# Patient Record
Sex: Female | Born: 1975
Health system: Southern US, Community
[De-identification: ages and names within clinical notes are randomized; demographics above are authoritative.]

## PROBLEM LIST (undated history)

## (undated) DIAGNOSIS — E038 Other specified hypothyroidism: Secondary | ICD-10-CM

## (undated) DIAGNOSIS — E063 Autoimmune thyroiditis: Secondary | ICD-10-CM

## (undated) HISTORY — DX: Other specified hypothyroidism: E03.8

## (undated) HISTORY — DX: Autoimmune thyroiditis: E06.3

---

## 2014-10-03 LAB — HM PAP SMEAR: HM Pap smear: NEGATIVE

## 2015-09-05 DIAGNOSIS — H5213 Myopia, bilateral: Secondary | ICD-10-CM | POA: Diagnosis not present

## 2015-09-05 DIAGNOSIS — H52223 Regular astigmatism, bilateral: Secondary | ICD-10-CM | POA: Diagnosis not present

## 2015-09-05 DIAGNOSIS — H5201 Hypermetropia, right eye: Secondary | ICD-10-CM | POA: Diagnosis not present

## 2015-09-13 DIAGNOSIS — J069 Acute upper respiratory infection, unspecified: Secondary | ICD-10-CM | POA: Diagnosis not present

## 2015-09-19 DIAGNOSIS — J029 Acute pharyngitis, unspecified: Secondary | ICD-10-CM | POA: Diagnosis not present

## 2015-09-19 DIAGNOSIS — J321 Chronic frontal sinusitis: Secondary | ICD-10-CM | POA: Diagnosis not present

## 2015-09-19 DIAGNOSIS — R0981 Nasal congestion: Secondary | ICD-10-CM | POA: Diagnosis not present

## 2015-09-19 DIAGNOSIS — R030 Elevated blood-pressure reading, without diagnosis of hypertension: Secondary | ICD-10-CM | POA: Diagnosis not present

## 2015-10-24 DIAGNOSIS — E02 Subclinical iodine-deficiency hypothyroidism: Secondary | ICD-10-CM | POA: Diagnosis not present

## 2015-10-24 DIAGNOSIS — E538 Deficiency of other specified B group vitamins: Secondary | ICD-10-CM | POA: Diagnosis not present

## 2015-10-24 DIAGNOSIS — Z6829 Body mass index (BMI) 29.0-29.9, adult: Secondary | ICD-10-CM | POA: Diagnosis not present

## 2015-10-24 DIAGNOSIS — E559 Vitamin D deficiency, unspecified: Secondary | ICD-10-CM | POA: Diagnosis not present

## 2015-10-24 DIAGNOSIS — Z01419 Encounter for gynecological examination (general) (routine) without abnormal findings: Secondary | ICD-10-CM | POA: Diagnosis not present

## 2016-04-07 DIAGNOSIS — N390 Urinary tract infection, site not specified: Secondary | ICD-10-CM | POA: Diagnosis not present

## 2016-05-02 DIAGNOSIS — N10 Acute tubulo-interstitial nephritis: Secondary | ICD-10-CM | POA: Diagnosis not present

## 2016-05-18 DIAGNOSIS — N390 Urinary tract infection, site not specified: Secondary | ICD-10-CM | POA: Diagnosis not present

## 2016-06-28 DIAGNOSIS — J069 Acute upper respiratory infection, unspecified: Secondary | ICD-10-CM | POA: Diagnosis not present

## 2016-08-06 DIAGNOSIS — Z719 Counseling, unspecified: Secondary | ICD-10-CM | POA: Diagnosis not present

## 2016-08-06 DIAGNOSIS — R350 Frequency of micturition: Secondary | ICD-10-CM | POA: Diagnosis not present

## 2016-10-13 DIAGNOSIS — L293 Anogenital pruritus, unspecified: Secondary | ICD-10-CM | POA: Diagnosis not present

## 2016-10-13 DIAGNOSIS — L723 Sebaceous cyst: Secondary | ICD-10-CM | POA: Diagnosis not present

## 2016-10-26 DIAGNOSIS — Z6829 Body mass index (BMI) 29.0-29.9, adult: Secondary | ICD-10-CM | POA: Diagnosis not present

## 2016-10-26 DIAGNOSIS — Z1231 Encounter for screening mammogram for malignant neoplasm of breast: Secondary | ICD-10-CM | POA: Diagnosis not present

## 2016-10-26 DIAGNOSIS — E02 Subclinical iodine-deficiency hypothyroidism: Secondary | ICD-10-CM | POA: Diagnosis not present

## 2016-10-26 DIAGNOSIS — Z01419 Encounter for gynecological examination (general) (routine) without abnormal findings: Secondary | ICD-10-CM | POA: Diagnosis not present

## 2016-10-28 DIAGNOSIS — H527 Unspecified disorder of refraction: Secondary | ICD-10-CM | POA: Diagnosis not present

## 2016-11-24 DIAGNOSIS — J069 Acute upper respiratory infection, unspecified: Secondary | ICD-10-CM | POA: Diagnosis not present

## 2016-11-24 DIAGNOSIS — H6502 Acute serous otitis media, left ear: Secondary | ICD-10-CM | POA: Diagnosis not present

## 2016-11-24 DIAGNOSIS — B9789 Other viral agents as the cause of diseases classified elsewhere: Secondary | ICD-10-CM | POA: Diagnosis not present

## 2017-03-04 DIAGNOSIS — E038 Other specified hypothyroidism: Secondary | ICD-10-CM | POA: Insufficient documentation

## 2017-03-04 DIAGNOSIS — E063 Autoimmune thyroiditis: Secondary | ICD-10-CM | POA: Diagnosis not present

## 2017-03-04 DIAGNOSIS — Z6830 Body mass index (BMI) 30.0-30.9, adult: Secondary | ICD-10-CM | POA: Diagnosis not present

## 2017-03-04 DIAGNOSIS — E663 Overweight: Secondary | ICD-10-CM | POA: Insufficient documentation

## 2017-03-04 DIAGNOSIS — Z79899 Other long term (current) drug therapy: Secondary | ICD-10-CM | POA: Diagnosis not present

## 2017-03-04 HISTORY — DX: Other specified hypothyroidism: E03.8

## 2017-03-04 HISTORY — DX: Autoimmune thyroiditis: E06.3

## 2017-03-10 ENCOUNTER — Ambulatory Visit (INDEPENDENT_AMBULATORY_CARE_PROVIDER_SITE_OTHER): Payer: BLUE CROSS/BLUE SHIELD | Admitting: Family Medicine

## 2017-03-10 ENCOUNTER — Encounter: Payer: Self-pay | Admitting: Family Medicine

## 2017-03-10 ENCOUNTER — Encounter (INDEPENDENT_AMBULATORY_CARE_PROVIDER_SITE_OTHER): Payer: Self-pay

## 2017-03-10 VITALS — BP 138/84 | HR 65 | Ht 67.0 in | Wt 190.0 lb

## 2017-03-10 DIAGNOSIS — E063 Autoimmune thyroiditis: Secondary | ICD-10-CM | POA: Diagnosis not present

## 2017-03-10 DIAGNOSIS — E663 Overweight: Secondary | ICD-10-CM

## 2017-03-10 DIAGNOSIS — E038 Other specified hypothyroidism: Secondary | ICD-10-CM | POA: Diagnosis not present

## 2017-03-10 NOTE — Progress Notes (Signed)
Subjective:    Patient ID: Megan Chavez, female    DOB: May 09, 1975, 41 y.o.   MRN: [id number]  HPI 41 year old female with a history of hypothyroidism comes in today to establish care.  I also see her husband Megan as a patient.  Her mother is Megan Chavez who is also a patient here.  She was diagnosed with Hashimoto's thyroiditis about a year and a half ago.  She is currently on levothyroxine.  Her dose was just increased a couple of weeks ago to 75 mcg daily.  She has a follow-up in about 4 weeks with her endocrinologist Dr. Rodney Booze.  She is coming in today to get established since she recently moved here with her husband and 3 children.  She admits she is gained a lot of weight over the last several years and thinks is partly because her thyroid was not well controlled.  She is trying to work on getting this back under control.  Review of Systems  Constitutional: Negative for diaphoresis, fever and unexpected weight change.  HENT: Negative for hearing loss, postnasal drip, sneezing and tinnitus.   Eyes: Negative for visual disturbance.  Respiratory: Negative for cough and wheezing.   Cardiovascular: Negative for chest pain and palpitations.  Genitourinary: Negative for vaginal bleeding and vaginal discharge.  Musculoskeletal: Negative for arthralgias.  Neurological: Negative for headaches.  Hematological: Negative for adenopathy. Does not bruise/bleed easily.     BP 138/84   Pulse 65   Ht 5' 7"  (1.702 m)   Wt 190 lb (86.2 kg)   BMI 29.76 kg/m     No Known Allergies  No past medical history on file.  Past Surgical History:  Procedure Laterality Date  . CESAREAN SECTION  10/2000  . CESAREAN SECTION  02/2003  . CESAREAN SECTION  06/2006    Social History   Socioeconomic History  . Marital status: Married    Spouse name: Lileigh Chavez  . Number of children: 3  . Years of education: Not on file  . Highest education level: Not on file  Social Needs  .  Financial resource strain: Not on file  . Food insecurity - worry: Not on file  . Food insecurity - inability: Not on file  . Transportation needs - medical: Not on file  . Transportation needs - non-medical: Not on file  Occupational History  . Occupation: Biochemist, clinical    Comment: [employer]   Tobacco Use  . Smoking status: Former Smoker    Last attempt to quit: 03/10/2000    Years since quitting: 17.0  Substance and Sexual Activity  . Alcohol use: Yes    Comment: 0-1  . Drug use: No  . Sexual activity: Yes  Other Topics Concern  . Not on file  Social History Narrative   She exercises 2-3 days/week for about 30 minutes.  2 caffeinated beverages daily.    Family History  Problem Relation Age of Onset  . Stroke Father   . Hypertension Father   . Hashimoto's thyroiditis Sister   . Leukemia Daughter     Outpatient Encounter Medications as of 03/10/2017  Medication Sig  . levothyroxine (SYNTHROID, LEVOTHROID) 75 MCG tablet Take by mouth.   No facility-administered encounter medications on file as of 03/10/2017.          Objective:   Physical Exam  Constitutional: She is oriented to person, place, and time. She appears well-developed and well-nourished.  HENT:  Head: Normocephalic and atraumatic.  Cardiovascular: Normal  rate, regular rhythm and normal heart sounds.  Pulmonary/Chest: Effort normal and breath sounds normal.  Neurological: She is alert and oriented to person, place, and time.  Skin: Skin is warm and dry.  Psychiatric: She has a normal mood and affect. Her behavior is normal.        Assessment & Plan:  Hashimoto's thyroiditis-following with endocrinology.  Medication recently changed will keep follow-up in about 4 weeks to recheck her levels.  She reports that her Pap smear mammogram are up-to-date.  She saw her OB/GYN in the summer and plans to have a repeat Pap smear in 5 years.  We will try to get these records and get our information  updated.  She is unsure about when her last tetanus vaccine was but plans to get this set information from records that we can make sure she is up-to-date if needed.  Overweight-she is planning on working on trying to lose weight.  Now that she feels like her thyroid is doing better.

## 2017-03-28 ENCOUNTER — Encounter: Payer: Self-pay | Admitting: Family Medicine

## 2017-03-28 ENCOUNTER — Ambulatory Visit (INDEPENDENT_AMBULATORY_CARE_PROVIDER_SITE_OTHER): Payer: BLUE CROSS/BLUE SHIELD

## 2017-03-28 ENCOUNTER — Ambulatory Visit (INDEPENDENT_AMBULATORY_CARE_PROVIDER_SITE_OTHER): Payer: BLUE CROSS/BLUE SHIELD | Admitting: Family Medicine

## 2017-03-28 VITALS — BP 138/68 | HR 74 | Ht 67.0 in | Wt 189.0 lb

## 2017-03-28 DIAGNOSIS — R1011 Right upper quadrant pain: Secondary | ICD-10-CM

## 2017-03-28 DIAGNOSIS — R140 Abdominal distension (gaseous): Secondary | ICD-10-CM | POA: Diagnosis not present

## 2017-03-28 NOTE — Progress Notes (Signed)
Subjective:    Patient ID: Megan Chavez, female    DOB: 05/21/1975, 41 y.o.   MRN: [id number]  HPI 41 year old female comes in today complaining of right upper quadrant pain just underneath the ribs but a little laterally.  She says sometimes it will actually radiate around to her back in the same area.  She describes it as a dull ache.  She is been more gassy and belching than usual.  She has had a little bit more reflux and even some rash recently.  She denies any fever sweats or chills.  She had one day where she felt a little nauseated but that was it.  No vomiting.  She says initially she was constipated but then yesterday she actually had a little bit of diarrhea.  She is tried Gas-X, Mylanta, and Tums with minimal relief.  She describes the pain as mild to moderate.   Review of Systems  BP 138/68   Pulse 74   Ht 5' 7"  (1.702 m)   Wt 189 lb (85.7 kg)   SpO2 100%   BMI 29.60 kg/m     No Known Allergies  No past medical history on file.  Past Surgical History:  Procedure Laterality Date  . CESAREAN SECTION  10/2000  . CESAREAN SECTION  02/2003  . CESAREAN SECTION  06/2006    Social History   Socioeconomic History  . Marital status: Married    Spouse name: Vonne Mcdanel  . Number of children: 3  . Years of education: Not on file  . Highest education level: Not on file  Social Needs  . Financial resource strain: Not on file  . Food insecurity - worry: Not on file  . Food insecurity - inability: Not on file  . Transportation needs - medical: Not on file  . Transportation needs - non-medical: Not on file  Occupational History  . Occupation: Biochemist, clinical    Comment: [employer]   Tobacco Use  . Smoking status: Former Smoker    Last attempt to quit: 03/10/2000    Years since quitting: 17.0  . Smokeless tobacco: Never Used  Substance and Sexual Activity  . Alcohol use: Yes    Comment: 0-1  . Drug use: No  . Sexual activity: Yes  Other Topics Concern  .  Not on file  Social History Narrative   She exercises 2-3 days/week for about 30 minutes.  2 caffeinated beverages daily.    Family History  Problem Relation Age of Onset  . Stroke Father   . Hypertension Father   . Hashimoto's thyroiditis Sister   . Leukemia Daughter     Outpatient Encounter Medications as of 03/28/2017  Medication Sig  . levothyroxine (SYNTHROID, LEVOTHROID) 75 MCG tablet Take by mouth.   No facility-administered encounter medications on file as of 03/28/2017.          Objective:   Physical Exam  Constitutional: She is oriented to person, place, and time. She appears well-developed and well-nourished.  HENT:  Head: Normocephalic and atraumatic.  Cardiovascular: Normal rate, regular rhythm and normal heart sounds.  Pulmonary/Chest: Effort normal and breath sounds normal.  Abdominal: Soft. Bowel sounds are normal. She exhibits no distension and no mass. There is tenderness. There is no rebound and no guarding.  No tenderness in the right upper quadrant.  No rebound or guarding.  Neurological: She is alert and oriented to person, place, and time.  Skin: Skin is warm and dry.  Psychiatric: She has a normal  mood and affect. Her behavior is normal.        Assessment & Plan:  Right upper quadrant pain radiating into her back.  Will check liver enzymes as well as renal function.  Consider gallbladder though she is not having any nausea.  I am most suspicious of increased stool burden so we will start with a KUB and then call her with results once available.  If she does have increased stool then will treat with MiraLAX.  If not then we will move forward with ultrasound of the gallbladder.

## 2017-04-29 DIAGNOSIS — J019 Acute sinusitis, unspecified: Secondary | ICD-10-CM | POA: Diagnosis not present

## 2017-07-18 ENCOUNTER — Encounter: Payer: Self-pay | Admitting: Family Medicine

## 2017-07-18 ENCOUNTER — Ambulatory Visit (INDEPENDENT_AMBULATORY_CARE_PROVIDER_SITE_OTHER): Payer: BLUE CROSS/BLUE SHIELD | Admitting: Family Medicine

## 2017-07-18 VITALS — BP 144/86 | HR 73 | Temp 98.1°F | Ht 67.0 in | Wt 190.0 lb

## 2017-07-18 DIAGNOSIS — L301 Dyshidrosis [pompholyx]: Secondary | ICD-10-CM | POA: Diagnosis not present

## 2017-07-18 DIAGNOSIS — Z23 Encounter for immunization: Secondary | ICD-10-CM

## 2017-07-18 DIAGNOSIS — L42 Pityriasis rosea: Secondary | ICD-10-CM

## 2017-07-18 MED ORDER — CLOBETASOL PROPIONATE 0.05 % EX CREA: 1.0000 "application " | TOPICAL_CREAM | Freq: Two times a day (BID) | CUTANEOUS | 2 refills | Status: DC

## 2017-07-18 MED ORDER — TRIAMCINOLONE 0.1 % CREAM:EUCERIN CREAM 1:1: 1.0000 "application " | TOPICAL_CREAM | Freq: Two times a day (BID) | CUTANEOUS | 12 refills | Status: DC | PRN

## 2017-07-18 NOTE — Progress Notes (Signed)
Megan Chavez is a 42 y.o. female with a history of hypothyroid, seasonal allergies, and dyshidrotic eczema who presents to [facility] today for evaluation of a rash. Megan Chavez reports that she developed a small rash on her left midsection about 4 weeks ago. Since then, it has spread around her midsection and onto her upper torso as well. She states that she has moderate seasonal allergies and was recently having problems with an indoor christmas tree. She denies shortness of breath, wheezing, and a cough. She denies recently switching laundry detergent and soaps. She states she has been treating this with hydrocortisone cream, allegra-D, Eucerin cream, and nightly benadryl with only mild relief.    No past medical history on file. Past Surgical History:  Procedure Laterality Date  . CESAREAN SECTION  10/2000  . CESAREAN SECTION  02/2003  . CESAREAN SECTION  06/2006   Social History   Tobacco Use  . Smoking status: Former Smoker    Last attempt to quit: 03/10/2000    Years since quitting: 17.3  . Smokeless tobacco: Never Used  Substance Use Topics  . Alcohol use: Yes    Comment: 0-1   family history includes Hashimoto's thyroiditis in her sister; Hypertension in her father; Leukemia in her daughter; Stroke in her father.  ROS as above:  Medications: Current Outpatient Medications  Medication Sig Dispense Refill  . Fexofenadine-Pseudoephedrine (ALLEGRA-D 24 HOUR PO) Take 1 tablet by mouth daily.    Marland Kitchen levothyroxine (SYNTHROID, LEVOTHROID) 75 MCG tablet Take by mouth.    . clobetasol cream (TEMOVATE) 2.54 % Apply 1 application topically 2 (two) times daily. Use on hot spots 60 g 2  . Triamcinolone Acetonide (TRIAMCINOLONE 0.1 % CREAM : EUCERIN) CREA Apply 1 application topically 2 (two) times daily as needed for itching. 50/50. Disp 1 pound 1 each 12   No current  facility-administered medications for this visit.    No Known Allergies  Health Maintenance Health Maintenance  Topic Date Due  . HIV Screening  09/20/1989  . TETANUS/TDAP  09/20/1993  . PAP SMEAR  09/21/1995  . INFLUENZA VACCINE  Completed     Exam:  BP (!) 144/86   Pulse 73   Temp 98.1 F (36.7 C) (Oral)   Ht 5' 7"  (1.702 m)   Wt 190 lb (86.2 kg)   BMI 29.76 kg/m  Gen: Well NAD HEENT: EOMI,  MMM Lungs: Normal work of breathing. CTABL Heart: RRR no MRG Abd: NABS, Soft. Nondistended, Nontender Exts: Brisk capillary refill, warm and well perfused.  Skin: Lower torso is covered with well demarcated raised erythematous ovid lesion with scale. Upper chest and shoulders have same but reduced.  Larger patch on left lower abdomen present. Hands are free from rash.     Assessment and Plan: 42 y.o. female with a history of eczema and seasonal allergies who is presenting with a rash on her lower torso and shoulders. She states that it started with one spot on her lower left stomach and spread from there. This initial lesion followed by rash spreading on her torso sounds like a "harold patch" followed by pityriasis rosea. She does not have wheezing, shortness of breath, or lung findings, so anaphylaxis is not likely.   Rash: Prescribe clobetosol for hot spots. 50/50 Eucerine/triamcinolone cream for application across torso.  Use clobetasol for hot spots.  Continue benadryl for itch relief at night and help with sleep. Continue allegra-D for seasonal allergies.  Call back if worsens  Dyshidrotic eczema: Well controlled currently.  Plan for watchful waiting use clobetasol as needed.  Tdap given.   Orders Placed This Encounter  Procedures  . Tdap vaccine greater than or equal to 7yo IM   Meds ordered this encounter  Medications  . Triamcinolone Acetonide (TRIAMCINOLONE 0.1 % CREAM : EUCERIN) CREA    Sig: Apply 1 application topically 2 (two) times daily as needed for itching.  50/50. Disp 1 pound    Dispense:  1 each    Refill:  12  . clobetasol cream (TEMOVATE) 0.05 %    Sig: Apply 1 application topically 2 (two) times daily. Use on hot spots    Dispense:  60 g    Refill:  2     Discussed warning signs or symptoms. Please see discharge instructions. Patient expresses understanding.

## 2017-07-18 NOTE — Patient Instructions (Addendum)
Thank you for coming in today. Use the triamcinolone cream on the body and clobetasol on the bad hot spots.  Recheck as needed.  This should fade in a few months.   We will get records.   Recheck as needed.   Pityriasis Rosea Pityriasis rosea is a rash that usually appears on the trunk of the body. It may also appear on the upper arms and upper legs. It usually begins as a single patch, and then more patches begin to develop. The rash may cause mild itching, but it normally does not cause other problems. It usually goes away without treatment. However, it may take weeks or months for the rash to go away completely. What are the causes? The cause of this condition is not known. The condition does not spread from person to person (is noncontagious). What increases the risk? This condition is more likely to develop in young adults and children. It is most common in the spring and fall. What are the signs or symptoms? The main symptom of this condition is a rash.  The rash usually begins with a single oval patch that is larger than the ones that follow. This is called a herald patch. It generally appears a week or more before the rest of the rash appears.  When more patches start to develop, they spread quickly on the trunk, back, and arms. These patches are smaller than the first one.  The patches that make up the rash are usually oval-shaped and pink or red in color. They are usually flat, but they may sometimes be raised so that they can be felt with a finger. They may also be finely crinkled and have a scaly ring around the edge.  The rash does not typically appear on areas of the skin that are exposed to the sun.  Most people who have this condition do not have other symptoms, but some have mild itching. In a few cases, a mild headache or body aches may occur before the rash appears and then go away. How is this diagnosed? Your health care provider may diagnose this condition by doing a  physical exam and taking your medical history. To rule out other possible causes for the rash, the health care provider may order blood tests or take a skin sample from the rash to be looked at under a microscope. How is this treated? Usually, treatment is not needed for this condition. The rash will probably go away on its own in 4-8 weeks. In some cases, a health care provider may recommend or prescribe medicine to reduce itching. Follow these instructions at home:  Take medicines only as directed by your health care provider.  Avoid scratching the affected areas of skin.  Do not take hot baths or use a sauna. Use only warm water when bathing or showering. Heat can increase itching. Contact a health care provider if:  Your rash does not go away in 8 weeks.  Your rash gets much worse.  You have a fever.  You have swelling or pain in the rash area.  You have fluid, blood, or pus coming from the rash area. This information is not intended to replace advice given to you by your health care provider. Make sure you discuss any questions you have with your health care provider. Document Released: 05/26/2001 Document Revised: 09/25/2015 Document Reviewed: 03/27/2014 Elsevier Interactive Patient Education  [employer].

## 2017-08-04 ENCOUNTER — Telehealth: Payer: Self-pay | Admitting: Family Medicine

## 2017-08-04 ENCOUNTER — Other Ambulatory Visit: Payer: Self-pay

## 2017-08-04 NOTE — Addendum Note (Signed)
Addended by: Dessie Coma on: 08/04/2017 08:22 AM   Modules accepted: Orders

## 2017-08-04 NOTE — Telephone Encounter (Signed)
Approvedtoday  Effective from 08/04/2017 through 08/03/2018.

## 2017-08-16 ENCOUNTER — Encounter: Payer: Self-pay | Admitting: Family Medicine

## 2017-08-16 NOTE — Progress Notes (Signed)
Negative for intraepithelial lesion or malignancy.

## 2017-11-22 DIAGNOSIS — E063 Autoimmune thyroiditis: Secondary | ICD-10-CM | POA: Diagnosis not present

## 2017-11-22 DIAGNOSIS — Z6830 Body mass index (BMI) 30.0-30.9, adult: Secondary | ICD-10-CM | POA: Diagnosis not present

## 2017-11-22 DIAGNOSIS — E039 Hypothyroidism, unspecified: Secondary | ICD-10-CM | POA: Diagnosis not present

## 2017-11-22 DIAGNOSIS — E038 Other specified hypothyroidism: Secondary | ICD-10-CM | POA: Diagnosis not present

## 2017-11-22 DIAGNOSIS — Z79899 Other long term (current) drug therapy: Secondary | ICD-10-CM | POA: Diagnosis not present

## 2017-11-22 DIAGNOSIS — E669 Obesity, unspecified: Secondary | ICD-10-CM | POA: Diagnosis not present

## 2017-12-21 DIAGNOSIS — Z124 Encounter for screening for malignant neoplasm of cervix: Secondary | ICD-10-CM | POA: Diagnosis not present

## 2017-12-21 DIAGNOSIS — Z01419 Encounter for gynecological examination (general) (routine) without abnormal findings: Secondary | ICD-10-CM | POA: Diagnosis not present

## 2017-12-21 DIAGNOSIS — R030 Elevated blood-pressure reading, without diagnosis of hypertension: Secondary | ICD-10-CM | POA: Diagnosis not present

## 2017-12-21 DIAGNOSIS — Z1231 Encounter for screening mammogram for malignant neoplasm of breast: Secondary | ICD-10-CM | POA: Diagnosis not present

## 2017-12-22 DIAGNOSIS — R928 Other abnormal and inconclusive findings on diagnostic imaging of breast: Secondary | ICD-10-CM | POA: Insufficient documentation

## 2017-12-28 DIAGNOSIS — R922 Inconclusive mammogram: Secondary | ICD-10-CM | POA: Diagnosis not present

## 2018-01-05 DIAGNOSIS — S9002XA Contusion of left ankle, initial encounter: Secondary | ICD-10-CM | POA: Diagnosis not present

## 2018-01-05 DIAGNOSIS — L089 Local infection of the skin and subcutaneous tissue, unspecified: Secondary | ICD-10-CM | POA: Diagnosis not present

## 2018-01-05 DIAGNOSIS — S93402A Sprain of unspecified ligament of left ankle, initial encounter: Secondary | ICD-10-CM | POA: Diagnosis not present

## 2018-01-05 DIAGNOSIS — S99912A Unspecified injury of left ankle, initial encounter: Secondary | ICD-10-CM | POA: Diagnosis not present

## 2018-01-05 DIAGNOSIS — S5011XA Contusion of right forearm, initial encounter: Secondary | ICD-10-CM | POA: Diagnosis not present

## 2018-01-11 ENCOUNTER — Ambulatory Visit (INDEPENDENT_AMBULATORY_CARE_PROVIDER_SITE_OTHER): Payer: BLUE CROSS/BLUE SHIELD | Admitting: Family Medicine

## 2018-01-11 ENCOUNTER — Encounter: Payer: Self-pay | Admitting: Family Medicine

## 2018-01-11 VITALS — BP 133/71 | HR 70 | Ht 67.0 in | Wt 192.0 lb

## 2018-01-11 DIAGNOSIS — L03116 Cellulitis of left lower limb: Secondary | ICD-10-CM

## 2018-01-11 MED ORDER — DOXYCYCLINE HYCLATE 100 MG PO TABS: 100.0000 mg | ORAL_TABLET | Freq: Two times a day (BID) | ORAL | 0 refills | Status: DC

## 2018-01-11 MED ORDER — MUPIROCIN 2 % EX OINT: TOPICAL_OINTMENT | CUTANEOUS | 3 refills | Status: DC

## 2018-01-11 NOTE — Patient Instructions (Addendum)
Thank you for coming in today. Keep the wound covered with ointment Use Mupericin ointment 2x daily.  STOP keflex Start doxycycline Do not take with calcium or iron.   It may increase the risk of sunburn so cover up.   Let us know when you get the flu shot.   If not getting better let me know.     Cellulitis, Adult Cellulitis is a skin infection. The infected area is usually red and tender. This condition occurs most often in the arms and lower legs. The infection can travel to the muscles, blood, and underlying tissue and become serious. It is very important to get treated for this condition. What are the causes? Cellulitis is caused by bacteria. The bacteria enter through a break in the skin, such as a cut, burn, insect bite, open sore, or crack. What increases the risk? This condition is more likely to occur in people who:  Have a weak defense system (immune system).  Have open wounds on the skin such as cuts, burns, bites, and scrapes. Bacteria can enter the body through these open wounds.  Are older.  Have diabetes.  Have a type of long-lasting (chronic) liver disease (cirrhosis) or kidney disease.  Use IV drugs.  What are the signs or symptoms? Symptoms of this condition include:  Redness, streaking, or spotting on the skin.  Swollen area of the skin.  Tenderness or pain when an area of the skin is touched.  Warm skin.  Fever.  Chills.  Blisters.  How is this diagnosed? This condition is diagnosed based on a medical history and physical exam. You may also have tests, including:  Blood tests.  Lab tests.  Imaging tests.  How is this treated? Treatment for this condition may include:  Medicines, such as antibiotic medicines or antihistamines.  Supportive care, such as rest and application of cold or warm cloths (cold or warm compresses) to the skin.  Hospital care, if the condition is severe.  The infection usually gets better within 1-2 days  of treatment. Follow these instructions at home:  Take over-the-counter and prescription medicines only as told by your health care provider.  If you were prescribed an antibiotic medicine, take it as told by your health care provider. Do not stop taking the antibiotic even if you start to feel better.  Drink enough fluid to keep your urine clear or pale yellow.  Do not touch or rub the infected area.  Raise (elevate) the infected area above the level of your heart while you are sitting or lying down.  Apply warm or cold compresses to the affected area as told by your health care provider.  Keep all follow-up visits as told by your health care provider. This is important. These visits let your health care provider make sure a more serious infection is not developing. Contact a health care provider if:  You have a fever.  Your symptoms do not improve within 1-2 days of starting treatment.  Your bone or joint underneath the infected area becomes painful after the skin has healed.  Your infection returns in the same area or another area.  You notice a swollen bump in the infected area.  You develop new symptoms.  You have a general ill feeling (malaise) with muscle aches and pains. Get help right away if:  Your symptoms get worse.  You feel very sleepy.  You develop vomiting or diarrhea that persists.  You notice red streaks coming from the infected area.  Your  red area gets larger or turns dark in color. This information is not intended to replace advice given to you by your health care provider. Make sure you discuss any questions you have with your health care provider. Document Released: 01/27/2005 Document Revised: 08/28/2015 Document Reviewed: 02/26/2015 Elsevier Interactive Patient Education  [employer].

## 2018-01-11 NOTE — Progress Notes (Signed)
Megan Chavez is a 42 y.o. female who presents to [facility] today for skin infection.   Megan Chavez was knocked over by her dog on September 3.  She suffered a contusion and abrasion to her left medial ankle.  She was seen in urgent care on the fifth where she was prescribed Keflex for the skin abrasion.  She notes that the contusion and ankle soreness has improved however she continues to have a ulcer with red tender skin on her medial ankle that is not improving with oral Keflex and topical Neosporin cream.  She denies fevers chills nausea vomiting or diarrhea.   ROS as above:  Exam:  BP 133/71   Pulse 70   Ht 5' 7"  (1.702 m)   Wt 192 lb (87.1 kg)   BMI 30.07 kg/m  Wt Readings from Last 5 Encounters:  01/11/18 192 lb (87.1 kg)  07/18/17 190 lb (86.2 kg)  03/28/17 189 lb (85.7 kg)  03/10/17 190 lb (86.2 kg)    Gen: Well NAD HEENT: EOMI,  MMM Lungs: Normal work of breathing. CTABL Heart: RRR no MRG Abd: NABS, Soft. Nondistended, Nontender Exts: Brisk capillary refill, warm and well perfused.  Skin left ankle shallow ulceration with surrounding erythema approximately 1 cm left medial ankle.  Tender to palpation no discharge present.  No significant induration or fluctuance. MSK: Left foot and ankle normal motion.  Normal gait.        Assessment and Plan: 42 y.o. female with cellulitis abrasion/ulceration.  Not improving with conventional typical oral and topical antibiotics.  Plan to stop oral Keflex and Neosporin cream.  Treat with oral doxycycline and topical mupirocin antibiotic ointment.  Recheck if not improving.  Tdap vaccine up-to-date March 2019.   No orders of the defined types were placed in this encounter.  Meds ordered this encounter  Medications  . doxycycline (VIBRA-TABS) 100 MG tablet    Sig: Take 1 tablet (100 mg total) by mouth 2 (two)  times daily.    Dispense:  14 tablet    Refill:  0  . mupirocin ointment (BACTROBAN) 2 %    Sig: Apply to affected area BID for 7 days.    Dispense:  30 g    Refill:  3     Historical information moved to improve visibility of documentation.  Past Medical History:  Diagnosis Date  . Hypothyroidism due to Hashimoto's thyroiditis 03/04/2017   Past Surgical History:  Procedure Laterality Date  . CESAREAN SECTION  10/2000  . CESAREAN SECTION  02/2003  . CESAREAN SECTION  06/2006   Social History   Tobacco Use  . Smoking status: Former Smoker    Last attempt to quit: 03/10/2000    Years since quitting: 17.8  . Smokeless tobacco: Never Used  Substance Use Topics  . Alcohol use: Yes    Comment: 0-1   family history includes Hashimoto's thyroiditis in her sister; Hypertension in her father; Leukemia in her daughter; Stroke in her father.  Medications: Current Outpatient Medications  Medication Sig Dispense Refill  . albuterol (PROAIR HFA) 108 (90 Base) MCG/ACT inhaler ProAir HFA 90 mcg/actuation aerosol inhaler  USE 2 PUFFS INHALE EVERY 4 HOURS AS NEEDED (PRN) WHEEZING    . clobetasol cream (TEMOVATE) 7.41 % Apply 1 application topically 2 (two) times daily. Use on hot spots 60 g 2  . Fexofenadine-Pseudoephedrine (ALLEGRA-D 24 HOUR PO) Take 1 tablet by mouth daily.    Marland Kitchen  hyoscyamine (ANASPAZ) 0.125 MG TBDP disintergrating tablet hyoscyamine 0.125 mg disintegrating tablet  take 1 tablet by mouth under the tongue every 4 to 6 hours if needed for cramping    . levothyroxine (SYNTHROID, LEVOTHROID) 75 MCG tablet Take by mouth.    . promethazine-dextromethorphan (PROMETHAZINE-DM) 6.25-15 MG/5ML syrup promethazine-DM 6.25 mg-15 mg/5 mL oral syrup    . Triamcinolone Acetonide (TRIAMCINOLONE 0.1 % CREAM : EUCERIN) CREA Apply 1 application topically 2 (two) times daily as needed for itching. 50/50. Disp 1 pound 1 each 12  . doxycycline (VIBRA-TABS) 100 MG tablet Take 1 tablet (100 mg total)  by mouth 2 (two) times daily. 14 tablet 0  . mupirocin ointment (BACTROBAN) 2 % Apply to affected area BID for 7 days. 30 g 3   No current facility-administered medications for this visit.    No Known Allergies   Discussed warning signs or symptoms. Please see discharge instructions. Patient expresses understanding.

## 2018-03-26 DIAGNOSIS — J209 Acute bronchitis, unspecified: Secondary | ICD-10-CM | POA: Diagnosis not present

## 2018-04-05 ENCOUNTER — Ambulatory Visit (INDEPENDENT_AMBULATORY_CARE_PROVIDER_SITE_OTHER): Payer: BLUE CROSS/BLUE SHIELD | Admitting: Family Medicine

## 2018-04-05 ENCOUNTER — Encounter: Payer: Self-pay | Admitting: Family Medicine

## 2018-04-05 VITALS — BP 125/84 | HR 74 | Ht 66.34 in | Wt 188.0 lb

## 2018-04-05 DIAGNOSIS — J0100 Acute maxillary sinusitis, unspecified: Secondary | ICD-10-CM | POA: Diagnosis not present

## 2018-04-05 DIAGNOSIS — Z23 Encounter for immunization: Secondary | ICD-10-CM

## 2018-04-05 MED ORDER — HYDROCODONE-HOMATROPINE 5-1.5 MG/5ML PO SYRP: 5.0000 mL | ORAL_SOLUTION | Freq: Every evening | ORAL | 0 refills | Status: DC | PRN

## 2018-04-05 MED ORDER — AMOXICILLIN-POT CLAVULANATE 875-125 MG PO TABS: 1.0000 | ORAL_TABLET | Freq: Two times a day (BID) | ORAL | 0 refills | Status: DC

## 2018-04-05 NOTE — Patient Instructions (Signed)

## 2018-04-05 NOTE — Progress Notes (Signed)
Acute Office Visit  Subjective:    Patient ID: Megan Chavez, female    DOB: Oct 08, 1975, 42 y.o.   MRN: [id number]  Chief Complaint  Patient presents with  . Sinusitis    HPI Patient is in today for sinus congestion and cough x 2 weeks. She says started with ST and fever to 101. She went to urgent care and had a negative flu swab.  She was given zpack for bronchitis but says it didn't really do anything.    Past Medical History:  Diagnosis Date  . Hypothyroidism due to Hashimoto's thyroiditis 03/04/2017    Past Surgical History:  Procedure Laterality Date  . CESAREAN SECTION  10/2000  . CESAREAN SECTION  02/2003  . CESAREAN SECTION  06/2006    Family History  Problem Relation Age of Onset  . Stroke Father   . Hypertension Father   . Hashimoto's thyroiditis Sister   . Leukemia Daughter     Social History   Socioeconomic History  . Marital status: Married    Spouse name: Denesia Donelan  . Number of children: 3  . Years of education: Not on file  . Highest education level: Not on file  Occupational History  . Occupation: Biochemist, clinical    Comment: [employer]   Social Needs  . Financial resource strain: Not on file  . Food insecurity:    Worry: Not on file    Inability: Not on file  . Transportation needs:    Medical: Not on file    Non-medical: Not on file  Tobacco Use  . Smoking status: Former Smoker    Last attempt to quit: 03/10/2000    Years since quitting: 18.0  . Smokeless tobacco: Never Used  Substance and Sexual Activity  . Alcohol use: Yes    Comment: 0-1  . Drug use: No  . Sexual activity: Yes  Lifestyle  . Physical activity:    Days per week: Not on file    Minutes per session: Not on file  . Stress: Not on file  Relationships  . Social connections:    Talks on phone: Not on file    Gets together: Not on file    Attends religious service: Not on file    Active member of club or organization: Not on file    Attends meetings of clubs  or organizations: Not on file    Relationship status: Not on file  . Intimate partner violence:    Fear of current or ex partner: Not on file    Emotionally abused: Not on file    Physically abused: Not on file    Forced sexual activity: Not on file  Other Topics Concern  . Not on file  Social History Narrative   She exercises 2-3 days/week for about 30 minutes.  2 caffeinated beverages daily.    Outpatient Medications Prior to Visit  Medication Sig Dispense Refill  . albuterol (PROAIR HFA) 108 (90 Base) MCG/ACT inhaler ProAir HFA 90 mcg/actuation aerosol inhaler  USE 2 PUFFS INHALE EVERY 4 HOURS AS NEEDED (PRN) WHEEZING    . levothyroxine (SYNTHROID, LEVOTHROID) 75 MCG tablet Take by mouth.    . clobetasol cream (TEMOVATE) 6.21 % Apply 1 application topically 2 (two) times daily. Use on hot spots 60 g 2  . doxycycline (VIBRA-TABS) 100 MG tablet Take 1 tablet (100 mg total) by mouth 2 (two) times daily. 14 tablet 0  . Fexofenadine-Pseudoephedrine (ALLEGRA-D 24 HOUR PO) Take 1 tablet by mouth daily.    Marland Kitchen  hyoscyamine (ANASPAZ) 0.125 MG TBDP disintergrating tablet hyoscyamine 0.125 mg disintegrating tablet  take 1 tablet by mouth under the tongue every 4 to 6 hours if needed for cramping    . mupirocin ointment (BACTROBAN) 2 % Apply to affected area BID for 7 days. 30 g 3  . promethazine-dextromethorphan (PROMETHAZINE-DM) 6.25-15 MG/5ML syrup promethazine-DM 6.25 mg-15 mg/5 mL oral syrup    . Triamcinolone Acetonide (TRIAMCINOLONE 0.1 % CREAM : EUCERIN) CREA Apply 1 application topically 2 (two) times daily as needed for itching. 50/50. Disp 1 pound 1 each 12   No facility-administered medications prior to visit.     Allergies  Allergen Reactions  . Molds & Smuts     Other reaction(s): Cough, Cough (ALLERGY/intolerance)  . Other Other (See Comments)    Other reaction(s): Other    ROS     Objective:    Physical Exam  Constitutional: She is oriented to person, place, and time.  She appears well-developed and well-nourished.  HENT:  Head: Normocephalic and atraumatic.  Right Ear: External ear normal.  Left Ear: External ear normal.  Nose: Nose normal.  Mouth/Throat: Oropharynx is clear and moist.  TMs and canals are clear.   Eyes: Pupils are equal, round, and reactive to light. Conjunctivae and EOM are normal.  Neck: Neck supple. No thyromegaly present.  Cardiovascular: Normal rate, regular rhythm and normal heart sounds.  Pulmonary/Chest: Effort normal and breath sounds normal. She has no wheezes.  Lymphadenopathy:    She has no cervical adenopathy.  Neurological: She is alert and oriented to person, place, and time.  Skin: Skin is warm and dry.  Psychiatric: She has a normal mood and affect.    BP 125/84   Pulse 74   Ht 5' 6.34" (1.685 m)   Wt 188 lb (85.3 kg)   SpO2 99%   BMI 30.03 kg/m  Wt Readings from Last 3 Encounters:  04/05/18 188 lb (85.3 kg)  01/11/18 192 lb (87.1 kg)  07/18/17 190 lb (86.2 kg)    Health Maintenance Due  Topic Date Due  . HIV Screening  09/20/1989    There are no preventive care reminders to display for this patient.   No results found for: TSH No results found for: WBC, HGB, HCT, MCV, PLT No results found for: NA, K, CHLORIDE, CO2, GLUCOSE, BUN, CREATININE, BILITOT, ALKPHOS, AST, ALT, PROT, ALBUMIN, CALCIUM, ANIONGAP, EGFR, GFR No results found for: CHOL No results found for: HDL No results found for: LDLCALC No results found for: TRIG No results found for: CHOLHDL No results found for: HGBA1C     Assessment & Plan:   Problem List Items Addressed This Visit    None    Visit Diagnoses    Acute non-recurrent maxillary sinusitis    -  Primary   Relevant Medications   amoxicillin-clavulanate (AUGMENTIN) 875-125 MG tablet   HYDROcodone-homatropine (HYCODAN) 5-1.5 MG/5ML syrup   Need for immunization against influenza       Relevant Orders   Flu Vaccine QUAD 36+ mos IM (Completed)    Call if not better  in one week on antibiotic.  She can use OTC cold medication. Given cough med for bedtime.     Meds ordered this encounter  Medications  . amoxicillin-clavulanate (AUGMENTIN) 875-125 MG tablet    Sig: Take 1 tablet by mouth 2 (two) times daily.    Dispense:  20 tablet    Refill:  0  . DISCONTD: HYDROcodone-homatropine (HYCODAN) 5-1.5 MG/5ML syrup    Sig:  Take 5 mLs by mouth at bedtime as needed for cough.    Dispense:  120 mL    Refill:  0  . HYDROcodone-homatropine (HYCODAN) 5-1.5 MG/5ML syrup    Sig: Take 5 mLs by mouth at bedtime as needed for cough.    Dispense:  120 mL    Refill:  0     Beatrice Lecher, MD

## 2018-05-26 DIAGNOSIS — E038 Other specified hypothyroidism: Secondary | ICD-10-CM | POA: Diagnosis not present

## 2018-05-26 DIAGNOSIS — E063 Autoimmune thyroiditis: Secondary | ICD-10-CM | POA: Diagnosis not present

## 2018-08-18 ENCOUNTER — Encounter: Payer: Self-pay | Admitting: Sports Medicine

## 2018-08-18 ENCOUNTER — Ambulatory Visit (INDEPENDENT_AMBULATORY_CARE_PROVIDER_SITE_OTHER): Payer: BLUE CROSS/BLUE SHIELD | Admitting: Sports Medicine

## 2018-08-18 DIAGNOSIS — K8020 Calculus of gallbladder without cholecystitis without obstruction: Secondary | ICD-10-CM | POA: Diagnosis not present

## 2018-08-18 DIAGNOSIS — K8050 Calculus of bile duct without cholangitis or cholecystitis without obstruction: Secondary | ICD-10-CM | POA: Diagnosis not present

## 2018-08-18 DIAGNOSIS — R1011 Right upper quadrant pain: Secondary | ICD-10-CM

## 2018-08-18 NOTE — Assessment & Plan Note (Addendum)
Right upper quadrant pain, worse with greasy foods, radiates to the shoulder blade, positive Murphy sign. Adding CBC, CMP, lipase, amylase, abdominal ultrasound and a referral to general surgery.  There is a large 9 mm nonobstructing right kidney [place], because this is sitting in the kidney itself, not in the ureter, and there is no blood in the urine I do not think this is symptomatic.   Gallbladder and cystic duct looked normal, adding HIDA scan.  HIDA scan is negative, adding pantoprazole 40 mg twice a day.

## 2018-08-18 NOTE — Patient Instructions (Signed)
Biliary Colic, Adult  Biliary colic is severe pain caused by a problem with a small organ in the upper right part of your belly (gallbladder). The gallbladder stores a digestive fluid produced in the liver (bile) that helps the body break down fat. Bile and other digestive enzymes are carried from the liver to the small intestine through tube-like structures (bile ducts). The gallbladder and the bile ducts form the biliary tract. Sometimes hard deposits of digestive fluids form in the gallbladder (gallstones) and block the flow of bile from the gallbladder, causing biliary colic. This condition is also called a gallbladder attack. Gallstones can be as small as a grain of sand or as big as a golf ball. There could be just one gallstone in the gallbladder, or there could be many. What are the causes? Biliary colic is usually caused by gallstones. Less often, a tumor could block the flow of bile from the gallbladder and trigger biliary colic. What increases the risk? This condition is more likely to develop in:  Women.  People of Hispanic descent.  People with a family history of gallstones.  People who are obese.  People who suddenly or quickly lose weight.  People who eat a high-calorie, low-fiber diet that is rich in refined carbs (carbohydrates), such as white bread and white rice.  People who have an intestinal disease that affects nutrient absorption, such as Crohn disease.  People who have a metabolic condition, such as metabolic syndrome or diabetes. What are the signs or symptoms? Severe pain in the upper right side of the belly is the main symptom of biliary colic. You may feel this pain below the chest but above the hip. This pain often occurs at night or after eating a very fatty meal. This pain may get worse for up to an hour and last as long as 12 hours. In most cases, the pain fades (subsides) within a couple hours. Other symptoms of this condition include:  Nausea and  vomiting.  Pain under the right shoulder. How is this diagnosed? This condition is diagnosed based on your medical history, your symptoms, and a physical exam. You may have tests, including:  Blood tests to rule out infection or inflammation of the bile ducts, gallbladder, pancreas, or liver.  Imaging studies such as: ? Ultrasound. ? CT scan. ? MRI. In some cases, you may need to have an imaging study done using a small amount of radioactive material (nuclear medicine) to confirm the diagnosis. How is this treated? Treatment for this condition may include medicine to relieve your pain or nausea. If you have gallstones that are causing biliary colic, you may need surgery to remove the gallbladder (cholecystectomy). Gallstones can also be dissolved gradually with medicine. It may take months or years before the gallstones are completely gone. Follow these instructions at home:  Take over-the-counter and prescription medicines only as told by your health care provider.  Drink enough fluid to keep your urine clear or pale yellow.  Follow instructions from your health care provider about eating or drinking restrictions. These may include avoiding: ? Fatty, greasy, and fried foods. ? Any foods that make the pain worse. ? Overeating. ? Having a large meal after not eating for a while.  Keep all follow-up visits as told by your health care provider. This is important. How is this prevented? Steps to prevent this condition include:  Maintaining a healthy body weight.  Getting regular exercise.  Eating a healthy, high-fiber, low-fat diet.  Limiting how much  sugar and refined carbs you eat, such as sweets, white flour, and white rice. Contact a health care provider if:  Your pain lasts more than 5 hours.  You vomit.  You have a fever and chills.  Your pain gets worse. Get help right away if:  Your skin or the whites of your eyes look yellow (jaundice).  Your have tea-colored  urine and light-colored stools.  You are dizzy or you faint. Summary  Biliary colic is severe pain caused by a problem with a small organ in the upper right part of your belly (gallbladder).  Treatments for this condition include medicines that relieves your pain or nausea and medicines that slowly dissolves the gallstones.  If gallstones cause your biliary colic, the treatment is surgery to remove the gallbladder (cholecystectomy). This information is not intended to replace advice given to you by your health care provider. Make sure you discuss any questions you have with your health care provider. Document Released: 09/20/2005 Document Revised: 10/25/2016 Document Reviewed: 11/03/2015 Elsevier Interactive Patient Education  2019 [employer].

## 2018-08-18 NOTE — Progress Notes (Addendum)
Subjective:    CC: Abdominal pain  HPI: On and off for several months this pleasant 43 year old female has had pain she localizes in her right upper quadrant with radiation around to her right shoulder blade, occurs with a bit of nausea, worse with greasy foods, caffeine.  She has tried omeprazole for some time now without any improvement.  No change in her stools, pain is crampy.  No fevers, chills, muscle aches, body aches, no skin rash.  I reviewed the past medical history, family history, social history, surgical history, and allergies today and no changes were needed.  Please see the problem list section below in epic for further details.  Past Medical History: Past Medical History:  Diagnosis Date  . Hypothyroidism due to Hashimoto's thyroiditis 03/04/2017   Past Surgical History: Past Surgical History:  Procedure Laterality Date  . CESAREAN SECTION  10/2000  . CESAREAN SECTION  02/2003  . CESAREAN SECTION  06/2006   Social History: Social History   Socioeconomic History  . Marital status: Married    Spouse name: Sunita Demond  . Number of children: 3  . Years of education: Not on file  . Highest education level: Not on file  Occupational History  . Occupation: Biochemist, clinical    Comment: [employer]   Social Needs  . Financial resource strain: Not on file  . Food insecurity:    Worry: Not on file    Inability: Not on file  . Transportation needs:    Medical: Not on file    Non-medical: Not on file  Tobacco Use  . Smoking status: Former Smoker    Last attempt to quit: 03/10/2000    Years since quitting: 18.4  . Smokeless tobacco: Never Used  Substance and Sexual Activity  . Alcohol use: Yes    Comment: 0-1  . Drug use: No  . Sexual activity: Yes  Lifestyle  . Physical activity:    Days per week: Not on file    Minutes per session: Not on file  . Stress: Not on file  Relationships  . Social connections:    Talks on phone: Not on file    Gets  together: Not on file    Attends religious service: Not on file    Active member of club or organization: Not on file    Attends meetings of clubs or organizations: Not on file    Relationship status: Not on file  Other Topics Concern  . Not on file  Social History Narrative   She exercises 2-3 days/week for about 30 minutes.  2 caffeinated beverages daily.   Family History: Family History  Problem Relation Age of Onset  . Stroke Father   . Hypertension Father   . Hashimoto's thyroiditis Sister   . Leukemia Daughter    Allergies: Allergies  Allergen Reactions  . Molds & Smuts     Other reaction(s): Cough, Cough (ALLERGY/intolerance)  . Other Other (See Comments)    Other reaction(s): Other   Medications: See med rec.  Review of Systems: No fevers, chills, night sweats, weight loss, chest pain, or shortness of breath.   Objective:    General: Well Developed, well nourished, and in no acute distress.  Neuro: Alert and oriented x3, extra-ocular muscles intact, sensation grossly intact.  HEENT: Normocephalic, atraumatic, pupils equal round reactive to light, neck supple, no masses, no lymphadenopathy, thyroid nonpalpable.  Skin: Warm and dry, no rashes. Cardiac: Regular rate and rhythm, no murmurs rubs or gallops, no lower  extremity edema.  Respiratory: Clear to auscultation bilaterally. Not using accessory muscles, speaking in full sentences. Abdomen: Soft, tender to palpation in the right upper quadrant, positive Murphy sign, no guarding, rigidity, rebound tenderness, no costovertebral angle pain.  Impression and Recommendations:    Abdominal pain, right upper quadrant Right upper quadrant pain, worse with greasy foods, radiates to the shoulder blade, positive Murphy sign. Adding CBC, CMP, lipase, amylase, abdominal ultrasound and a referral to general surgery.  There is a large 9 mm nonobstructing right kidney [place], because this is sitting in the kidney itself, not in  the ureter, and there is no blood in the urine I do not think this is symptomatic.   Gallbladder and cystic duct looked normal, adding HIDA scan.  HIDA scan is negative, adding pantoprazole 40 mg twice a day.   ___________________________________________ Gwen Her. Dianah Field, M.D., ABFM., CAQSM. Primary Care and Sports Medicine [facility]  Adjunct Professor of [employer]

## 2018-08-19 LAB — URINALYSIS W MICROSCOPIC + REFLEX CULTURE
Bacteria, UA: NONE SEEN /HPF
Bilirubin Urine: NEGATIVE
Glucose, UA: NEGATIVE
Hgb urine dipstick: NEGATIVE
Hyaline Cast: NONE SEEN /LPF
Ketones, ur: NEGATIVE
Leukocyte Esterase: NEGATIVE
Nitrites, Initial: NEGATIVE
Protein, ur: NEGATIVE
Specific Gravity, Urine: 1.018 (ref 1.001–1.03)
pH: 6.5 (ref 5.0–8.0)

## 2018-08-19 LAB — COMPLETE METABOLIC PANEL WITH GFR
AG Ratio: 1.7 (calc) (ref 1.0–2.5)
ALT: 19 U/L (ref 6–29)
AST: 14 U/L (ref 10–30)
Albumin: 4.3 g/dL (ref 3.6–5.1)
Alkaline phosphatase (APISO): 87 U/L (ref 31–125)
BUN: 12 mg/dL (ref 7–25)
CO2: 26 mmol/L (ref 20–32)
Calcium: 9.5 mg/dL (ref 8.6–10.2)
Chloride: 105 mmol/L (ref 98–110)
Creat: 0.79 mg/dL (ref 0.50–1.10)
GFR, Est African American: 106 mL/min/{1.73_m2} (ref >=60)
GFR, Est Non African American: 92 mL/min/{1.73_m2} (ref >=60)
Globulin: 2.5 g/dL (calc) (ref 1.9–3.7)
Glucose, Bld: 100 mg/dL — ABNORMAL HIGH (ref 65–99)
Potassium: 4.6 mmol/L (ref 3.5–5.3)
Sodium: 139 mmol/L (ref 135–146)
Total Bilirubin: 0.4 mg/dL (ref 0.2–1.2)
Total Protein: 6.8 g/dL (ref 6.1–8.1)

## 2018-08-19 LAB — AMYLASE: Amylase: 55 U/L (ref 21–101)

## 2018-08-19 LAB — CBC
HCT: 40.8 % (ref 35.0–45.0)
Hemoglobin: 13.8 g/dL (ref 11.7–15.5)
MCH: 28.6 pg (ref 27.0–33.0)
MCHC: 33.8 g/dL (ref 32.0–36.0)
MCV: 84.5 fL (ref 80.0–100.0)
MPV: 10.6 fL (ref 7.5–12.5)
Platelets: 316 10*3/uL (ref 140–400)
RBC: 4.83 10*6/uL (ref 3.80–5.10)
RDW: 12 % (ref 11.0–15.0)
WBC: 5.8 10*3/uL (ref 3.8–10.8)

## 2018-08-19 LAB — LIPASE: Lipase: 41 U/L (ref 7–60)

## 2018-08-19 LAB — NO CULTURE INDICATED

## 2018-08-21 ENCOUNTER — Ambulatory Visit (INDEPENDENT_AMBULATORY_CARE_PROVIDER_SITE_OTHER): Payer: BLUE CROSS/BLUE SHIELD

## 2018-08-21 ENCOUNTER — Other Ambulatory Visit: Payer: Self-pay

## 2018-08-21 DIAGNOSIS — R109 Unspecified abdominal pain: Secondary | ICD-10-CM | POA: Diagnosis not present

## 2018-08-21 DIAGNOSIS — K8050 Calculus of bile duct without cholangitis or cholecystitis without obstruction: Secondary | ICD-10-CM | POA: Diagnosis not present

## 2018-08-21 NOTE — Progress Notes (Signed)
NM order updated to ASAP per Provider request

## 2018-08-21 NOTE — Addendum Note (Signed)
Addended by: Alena Bills R on: 08/21/2018 02:24 PM   Modules accepted: Orders

## 2018-08-21 NOTE — Addendum Note (Signed)
Addended by: Alena Bills R on: 08/21/2018 02:19 PM   Modules accepted: Orders

## 2018-08-21 NOTE — Addendum Note (Signed)
Addended by: Silverio Decamp on: 08/21/2018 10:26 AM   Modules accepted: Orders

## 2018-08-24 ENCOUNTER — Telehealth: Payer: Self-pay | Admitting: *Deleted

## 2018-08-24 DIAGNOSIS — R1084 Generalized abdominal pain: Secondary | ICD-10-CM

## 2018-08-24 DIAGNOSIS — K8050 Calculus of bile duct without cholangitis or cholecystitis without obstruction: Secondary | ICD-10-CM

## 2018-08-24 NOTE — Telephone Encounter (Signed)
Ct approved. Imaging notified.

## 2018-08-24 NOTE — Telephone Encounter (Signed)
Pt advised. OK to proceed.

## 2018-08-24 NOTE — Telephone Encounter (Signed)
Pt called today & wanted to pass on some information. Back in 2008 she had a tubal ligation with clips.  Sometime around 2012-2013, she started experiencing the same types of abdominal symptoms she's having now.  Went through the same gallbladder work up as well as kidney Ludvigsen work up.  What happened was one of the clips had came undone and moved up to her liver.  This was found on x-ray and removed.  She wanted to run this by you and ask if it's worth getting an updated abd xr just in case.  Please advise.

## 2018-08-24 NOTE — Telephone Encounter (Signed)
It is still likely biliary colic from the gallbladder, sometimes imaging can be negative, this is why I ordered the HIDA scan.  However considering history of intra-abdominal foreign body I am going to add a CT of the abdomen and pelvis with oral and IV contrast, if we are looking for a foreign body we do need three-dimensional imaging.

## 2018-08-24 NOTE — Addendum Note (Signed)
Addended by: Silverio Decamp on: 08/24/2018 12:01 PM   Modules accepted: Orders

## 2018-08-25 ENCOUNTER — Other Ambulatory Visit: Payer: Self-pay

## 2018-08-25 ENCOUNTER — Encounter (HOSPITAL_COMMUNITY): Payer: BLUE CROSS/BLUE SHIELD

## 2018-08-25 ENCOUNTER — Ambulatory Visit (INDEPENDENT_AMBULATORY_CARE_PROVIDER_SITE_OTHER): Payer: BLUE CROSS/BLUE SHIELD

## 2018-08-25 DIAGNOSIS — R1084 Generalized abdominal pain: Secondary | ICD-10-CM | POA: Diagnosis not present

## 2018-08-25 DIAGNOSIS — K8050 Calculus of bile duct without cholangitis or cholecystitis without obstruction: Secondary | ICD-10-CM | POA: Diagnosis not present

## 2018-08-25 DIAGNOSIS — N200 Calculus of kidney: Secondary | ICD-10-CM | POA: Diagnosis not present

## 2018-08-25 MED ORDER — IOHEXOL 300 MG/ML  SOLN
100.0000 mL | Freq: Once | INTRAMUSCULAR | Status: AC | PRN
  Administered 2018-08-25: 100 mL via INTRAVENOUS

## 2018-08-29 ENCOUNTER — Telehealth: Payer: Self-pay | Admitting: *Deleted

## 2018-08-29 NOTE — Telephone Encounter (Signed)
Pt advised that the results of the CT is why the HIDA scan is being done. Asked if she has any more questions and she did not.Marland KitchenMarland KitchenElouise Chavez, [place]

## 2018-08-29 NOTE — Telephone Encounter (Signed)
No, that is why we are getting the HIDA scan.

## 2018-08-29 NOTE — Telephone Encounter (Signed)
Pt wanted to know if anything showed up on the CT Scan that would warrant her to go have the Hawi scan done tomorrow. She just doesn't want to go thru a bunch of different procedures if this is not necessary.Maryruth Eve, Lahoma Crocker, CMA   (Vonore I'm working from home please do not send back to me. I only got this because I was speaking w/her husband about his results.)

## 2018-08-30 ENCOUNTER — Other Ambulatory Visit: Payer: Self-pay

## 2018-08-30 ENCOUNTER — Ambulatory Visit (HOSPITAL_COMMUNITY)
Admission: RE | Admit: 2018-08-30 | Discharge: 2018-08-30 | Disposition: A | Discharge status: Home / Self Care | Payer: BLUE CROSS/BLUE SHIELD | Source: Ambulatory Visit | Attending: Sports Medicine | Admitting: Sports Medicine

## 2018-08-30 DIAGNOSIS — K8020 Calculus of gallbladder without cholecystitis without obstruction: Secondary | ICD-10-CM | POA: Insufficient documentation

## 2018-08-30 DIAGNOSIS — K8050 Calculus of bile duct without cholangitis or cholecystitis without obstruction: Secondary | ICD-10-CM | POA: Diagnosis not present

## 2018-08-30 DIAGNOSIS — R109 Unspecified abdominal pain: Secondary | ICD-10-CM | POA: Diagnosis not present

## 2018-08-30 MED ORDER — PANTOPRAZOLE SODIUM 40 MG PO TBEC: 40.0000 mg | DELAYED_RELEASE_TABLET | Freq: Two times a day (BID) | ORAL | 0 refills | Status: DC

## 2018-08-30 MED ORDER — TECHNETIUM TC 99M MEBROFENIN IV KIT
5.4000 | PACK | Freq: Once | INTRAVENOUS | Status: AC | PRN
  Administered 2018-08-30: 5.4 via INTRAVENOUS

## 2018-08-30 NOTE — Addendum Note (Signed)
Addended by: Silverio Decamp on: 08/30/2018 01:20 PM   Modules accepted: Orders

## 2018-09-01 ENCOUNTER — Other Ambulatory Visit: Payer: Self-pay | Admitting: Sports Medicine

## 2018-09-01 DIAGNOSIS — R1011 Right upper quadrant pain: Secondary | ICD-10-CM

## 2018-09-01 DIAGNOSIS — K8050 Calculus of bile duct without cholangitis or cholecystitis without obstruction: Secondary | ICD-10-CM

## 2018-09-01 MED ORDER — PANTOPRAZOLE SODIUM 40 MG PO TBEC: 40.0000 mg | DELAYED_RELEASE_TABLET | Freq: Two times a day (BID) | ORAL | 1 refills | Status: DC

## 2018-09-01 NOTE — Addendum Note (Signed)
Addended by: Silverio Decamp on: 09/01/2018 03:56 PM   Modules accepted: Orders

## 2019-02-20 DIAGNOSIS — Z20828 Contact with and (suspected) exposure to other viral communicable diseases: Secondary | ICD-10-CM | POA: Diagnosis not present

## 2019-04-03 ENCOUNTER — Encounter: Payer: Self-pay | Admitting: Family Medicine

## 2019-04-03 ENCOUNTER — Ambulatory Visit (INDEPENDENT_AMBULATORY_CARE_PROVIDER_SITE_OTHER): Payer: BC Managed Care – PPO | Admitting: Family Medicine

## 2019-04-03 ENCOUNTER — Other Ambulatory Visit (HOSPITAL_COMMUNITY)
Admission: RE | Admit: 2019-04-03 | Discharge: 2019-04-03 | Disposition: A | Discharge status: Home / Self Care | Payer: BC Managed Care – PPO | Source: Ambulatory Visit | Attending: Family Medicine | Admitting: Family Medicine

## 2019-04-03 ENCOUNTER — Other Ambulatory Visit: Payer: Self-pay

## 2019-04-03 VITALS — BP 133/67 | HR 69 | Ht 66.0 in | Wt 187.0 lb

## 2019-04-03 DIAGNOSIS — Z1231 Encounter for screening mammogram for malignant neoplasm of breast: Secondary | ICD-10-CM | POA: Diagnosis not present

## 2019-04-03 DIAGNOSIS — Z23 Encounter for immunization: Secondary | ICD-10-CM | POA: Diagnosis not present

## 2019-04-03 DIAGNOSIS — Z0000 Encounter for general adult medical examination without abnormal findings: Secondary | ICD-10-CM

## 2019-04-03 DIAGNOSIS — E7800 Pure hypercholesterolemia, unspecified: Secondary | ICD-10-CM | POA: Diagnosis not present

## 2019-04-03 DIAGNOSIS — Z124 Encounter for screening for malignant neoplasm of cervix: Secondary | ICD-10-CM

## 2019-04-03 DIAGNOSIS — N3281 Overactive bladder: Secondary | ICD-10-CM | POA: Insufficient documentation

## 2019-04-03 NOTE — Progress Notes (Signed)
Subjective:     Megan Chavez is a 43 y.o. female and is here for a comprehensive physical exam. The patient reports no problems. She says her last pap smear was a year ago in [place]. She is  Due for mammogram as well.    Social History   Socioeconomic History  . Marital status: Married    Spouse name: Ilayda Toda  . Number of children: 3  . Years of education: Not on file  . Highest education level: Not on file  Occupational History  . Occupation: Biochemist, clinical    Comment: [employer]   Social Needs  . Financial resource strain: Not on file  . Food insecurity    Worry: Not on file    Inability: Not on file  . Transportation needs    Medical: Not on file    Non-medical: Not on file  Tobacco Use  . Smoking status: Former Smoker    Quit date: 03/10/2000    Years since quitting: 19.0  . Smokeless tobacco: Never Used  Substance and Sexual Activity  . Alcohol use: Yes    Comment: 0-1  . Drug use: No  . Sexual activity: Yes  Lifestyle  . Physical activity    Days per week: Not on file    Minutes per session: Not on file  . Stress: Not on file  Relationships  . Social Herbalist on phone: Not on file    Gets together: Not on file    Attends religious service: Not on file    Active member of club or organization: Not on file    Attends meetings of clubs or organizations: Not on file    Relationship status: Not on file  . Intimate partner violence    Fear of current or ex partner: Not on file    Emotionally abused: Not on file    Physically abused: Not on file    Forced sexual activity: Not on file  Other Topics Concern  . Not on file  Social History Narrative   She exercises 2-3 days/week for about 30 minutes.  2 caffeinated beverages daily.   Health Maintenance  Topic Date Due  . HIV Screening  04/02/2020 (Originally 09/20/1989)  . PAP SMEAR-Modifier  10/03/2019  . TETANUS/TDAP  07/19/2027  . INFLUENZA VACCINE  Completed    The following  portions of the patient's history were reviewed and updated as appropriate: allergies, current medications, past family history, past medical history, past social history, past surgical history and problem list.  Review of Systems A comprehensive review of systems was negative.   Objective:    BP 133/67   Pulse 69   Ht 5' 6"  (1.676 m)   Wt 187 lb (84.8 kg)   LMP 04/03/2019 (Exact Date)   SpO2 99%   BMI 30.18 kg/m  General appearance: alert, cooperative and appears stated age Head: Normocephalic, without obvious abnormality, atraumatic Eyes: conj cler, EOMI, PEERLA Ears: normal TM's and external ear canals both ears Nose: Nares normal. Septum midline. Mucosa normal. No drainage or sinus tenderness. Throat: not examined Neck: no adenopathy, no carotid bruit, no JVD, supple, symmetrical, trachea midline and thyroid not enlarged, symmetric, no tenderness/mass/nodules Back: symmetric, no curvature. ROM normal. No CVA tenderness. Lungs: clear to auscultation bilaterally Breasts: normal appearance, no masses or tenderness Heart: regular rate and rhythm, S1, S2 normal, no murmur, click, rub or gallop Abdomen: soft, non-tender; bowel sounds normal; no masses,  no organomegaly Pelvic: cervix normal in  appearance, external genitalia normal, no adnexal masses or tenderness, no cervical motion tenderness, rectovaginal septum normal, uterus normal size, shape, and consistency and vagina normal without discharge Extremities: extremities normal, atraumatic, no cyanosis or edema Pulses: 2+ and symmetric Skin: Skin color, texture, turgor normal. No rashes or lesions Lymph nodes: Cervical, supraclavicular, and axillary nodes normal. Neurologic: Alert and oriented X 3, normal strength and tone. Normal symmetric reflexes. Normal coordination and gait    Assessment:    Healthy female exam.      Plan:     See After Visit Summary for Counseling Recommendations   Keep up a regular exercise program  and make sure you are eating a healthy diet Try to eat 4 servings of dairy a day, or if you are lactose intolerant take a calcium with vitamin D daily.  Your vaccines are up to date.

## 2019-04-03 NOTE — Patient Instructions (Signed)
Health Maintenance, Female Adopting a healthy lifestyle and getting preventive care are important in promoting health and wellness. Ask your health care provider about:  The right schedule for you to have regular tests and exams.  Things you can do on your own to prevent diseases and keep yourself healthy. What should I know about diet, weight, and exercise? Eat a healthy diet   Eat a diet that includes plenty of vegetables, fruits, low-fat dairy products, and lean protein.  Do not eat a lot of foods that are high in solid fats, added sugars, or sodium. Maintain a healthy weight Body mass index (BMI) is used to identify weight problems. It estimates body fat based on height and weight. Your health care provider can help determine your BMI and help you achieve or maintain a healthy weight. Get regular exercise Get regular exercise. This is one of the most important things you can do for your health. Most adults should:  Exercise for at least 150 minutes each week. The exercise should increase your heart rate and make you sweat (moderate-intensity exercise).  Do strengthening exercises at least twice a week. This is in addition to the moderate-intensity exercise.  Spend less time sitting. Even light physical activity can be beneficial. Watch cholesterol and blood lipids Have your blood tested for lipids and cholesterol at 44 years of age, then have this test every 5 years. Have your cholesterol levels checked more often if:  Your lipid or cholesterol levels are high.  You are older than 44 years of age.  You are at high risk for heart disease. What should I know about cancer screening? Depending on your health history and family history, you may need to have cancer screening at various ages. This may include screening for:  Breast cancer.  Cervical cancer.  Colorectal cancer.  Skin cancer.  Lung cancer. What should I know about heart disease, diabetes, and high blood  pressure? Blood pressure and heart disease  High blood pressure causes heart disease and increases the risk of stroke. This is more likely to develop in people who have high blood pressure readings, are of African descent, or are overweight.  Have your blood pressure checked: ? Every 3-5 years if you are 18-39 years of age. ? Every year if you are 40 years old or older. Diabetes Have regular diabetes screenings. This checks your fasting blood sugar level. Have the screening done:  Once every three years after age 40 if you are at a normal weight and have a low risk for diabetes.  More often and at a younger age if you are overweight or have a high risk for diabetes. What should I know about preventing infection? Hepatitis B If you have a higher risk for hepatitis B, you should be screened for this virus. Talk with your health care provider to find out if you are at risk for hepatitis B infection. Hepatitis C Testing is recommended for:  Everyone born from 1945 through 1965.  Anyone with known risk factors for hepatitis C. Sexually transmitted infections (STIs)  Get screened for STIs, including gonorrhea and chlamydia, if: ? You are sexually active and are younger than 44 years of age. ? You are older than 44 years of age and your health care provider tells you that you are at risk for this type of infection. ? Your sexual activity has changed since you were last screened, and you are at increased risk for chlamydia or gonorrhea. Ask your health care provider if   you are at risk.  Ask your health care provider about whether you are at high risk for HIV. Your health care provider may recommend a prescription medicine to help prevent HIV infection. If you choose to take medicine to prevent HIV, you should first get tested for HIV. You should then be tested every 3 months for as long as you are taking the medicine. Pregnancy  If you are about to stop having your period (premenopausal) and  you may become pregnant, seek counseling before you get pregnant.  Take 400 to 800 micrograms (mcg) of folic acid every day if you become pregnant.  Ask for birth control (contraception) if you want to prevent pregnancy. Osteoporosis and menopause Osteoporosis is a disease in which the bones lose minerals and strength with aging. This can result in bone fractures. If you are 65 years old or older, or if you are at risk for osteoporosis and fractures, ask your health care provider if you should:  Be screened for bone loss.  Take a calcium or vitamin D supplement to lower your risk of fractures.  Be given hormone replacement therapy (HRT) to treat symptoms of menopause. Follow these instructions at home: Lifestyle  Do not use any products that contain nicotine or tobacco, such as cigarettes, e-cigarettes, and chewing tobacco. If you need help quitting, ask your health care provider.  Do not use street drugs.  Do not share needles.  Ask your health care provider for help if you need support or information about quitting drugs. Alcohol use  Do not drink alcohol if: ? Your health care provider tells you not to drink. ? You are pregnant, may be pregnant, or are planning to become pregnant.  If you drink alcohol: ? Limit how much you use to 0-1 drink a day. ? Limit intake if you are breastfeeding.  Be aware of how much alcohol is in your drink. In the [place] one drink equals one 12 oz bottle of beer (355 mL), one 5 oz glass of wine (148 mL), or one 1 oz glass of hard liquor (44 mL). General instructions  Schedule regular health, dental, and eye exams.  Stay current with your vaccines.  Tell your health care provider if: ? You often feel depressed. ? You have ever been abused or do not feel safe at home. Summary  Adopting a healthy lifestyle and getting preventive care are important in promoting health and wellness.  Follow your health care provider's instructions about healthy  diet, exercising, and getting tested or screened for diseases.  Follow your health care provider's instructions on monitoring your cholesterol and blood pressure. This information is not intended to replace advice given to you by your health care provider. Make sure you discuss any questions you have with your health care provider. Document Released: 11/02/2010 Document Revised: 04/12/2018 Document Reviewed: 04/12/2018 Elsevier Patient Education  2020 [employer]  

## 2019-04-04 LAB — COMPLETE METABOLIC PANEL WITH GFR
AG Ratio: 1.7 (calc) (ref 1.0–2.5)
ALT: 14 U/L (ref 6–29)
AST: 15 U/L (ref 10–30)
Albumin: 4.2 g/dL (ref 3.6–5.1)
Alkaline phosphatase (APISO): 86 U/L (ref 31–125)
BUN: 15 mg/dL (ref 7–25)
CO2: 27 mmol/L (ref 20–32)
Calcium: 9.3 mg/dL (ref 8.6–10.2)
Chloride: 104 mmol/L (ref 98–110)
Creat: 0.84 mg/dL (ref 0.50–1.10)
GFR, Est African American: 98 mL/min/{1.73_m2} (ref >=60)
GFR, Est Non African American: 85 mL/min/{1.73_m2} (ref >=60)
Globulin: 2.5 g/dL (calc) (ref 1.9–3.7)
Glucose, Bld: 95 mg/dL (ref 65–99)
Potassium: 4.5 mmol/L (ref 3.5–5.3)
Sodium: 139 mmol/L (ref 135–146)
Total Bilirubin: 0.5 mg/dL (ref 0.2–1.2)
Total Protein: 6.7 g/dL (ref 6.1–8.1)

## 2019-04-04 LAB — CBC
HCT: 38.6 % (ref 35.0–45.0)
Hemoglobin: 13.1 g/dL (ref 11.7–15.5)
MCH: 29.2 pg (ref 27.0–33.0)
MCHC: 33.9 g/dL (ref 32.0–36.0)
MCV: 86 fL (ref 80.0–100.0)
MPV: 10.8 fL (ref 7.5–12.5)
Platelets: 292 10*3/uL (ref 140–400)
RBC: 4.49 10*6/uL (ref 3.80–5.10)
RDW: 11.9 % (ref 11.0–15.0)
WBC: 4.4 10*3/uL (ref 3.8–10.8)

## 2019-04-04 LAB — LIPID PANEL
Cholesterol: 214 mg/dL — ABNORMAL HIGH (ref <200)
HDL: 62 mg/dL (ref >=50)
LDL Cholesterol (Calc): 135 mg/dL (calc) — ABNORMAL HIGH
Non-HDL Cholesterol (Calc): 152 mg/dL (calc) — ABNORMAL HIGH (ref <130)
Total CHOL/HDL Ratio: 3.5 (calc) (ref <5.0)
Triglycerides: 78 mg/dL (ref <150)

## 2019-04-05 LAB — CYTOLOGY - PAP
Comment: NEGATIVE
Diagnosis: NEGATIVE
High risk HPV: NEGATIVE

## 2019-04-05 NOTE — Progress Notes (Signed)
Call patient: Your Pap smear is normal. Repeat in 5 years.

## 2019-04-20 DIAGNOSIS — Z1231 Encounter for screening mammogram for malignant neoplasm of breast: Secondary | ICD-10-CM | POA: Diagnosis not present

## 2019-05-29 DIAGNOSIS — E038 Other specified hypothyroidism: Secondary | ICD-10-CM | POA: Diagnosis not present

## 2019-05-29 DIAGNOSIS — E063 Autoimmune thyroiditis: Secondary | ICD-10-CM | POA: Diagnosis not present

## 2019-06-09 DIAGNOSIS — J019 Acute sinusitis, unspecified: Secondary | ICD-10-CM | POA: Diagnosis not present

## 2019-06-19 DIAGNOSIS — E7800 Pure hypercholesterolemia, unspecified: Secondary | ICD-10-CM | POA: Insufficient documentation

## 2019-08-12 DIAGNOSIS — Z23 Encounter for immunization: Secondary | ICD-10-CM | POA: Diagnosis not present

## 2019-09-27 ENCOUNTER — Telehealth (INDEPENDENT_AMBULATORY_CARE_PROVIDER_SITE_OTHER): Payer: BC Managed Care – PPO | Admitting: Family Medicine

## 2019-09-27 ENCOUNTER — Encounter: Payer: Self-pay | Admitting: Family Medicine

## 2019-09-27 VITALS — Temp 97.6°F | Ht 66.0 in | Wt 187.0 lb

## 2019-09-27 DIAGNOSIS — J019 Acute sinusitis, unspecified: Secondary | ICD-10-CM | POA: Diagnosis not present

## 2019-09-27 MED ORDER — AMOXICILLIN-POT CLAVULANATE 875-125 MG PO TABS: 1.0000 | ORAL_TABLET | Freq: Two times a day (BID) | ORAL | 0 refills | Status: DC

## 2019-09-27 NOTE — Progress Notes (Signed)
Was doing yard work prior to symptoms Started 2 weeks ago Congestion/drainage Cough due to drainage - worse in AM No fever Sinus pressure Teeth ache No sore throat, no other symptoms  Has tried Sudafed, Dayquil, nasal sprays OTC - nothing helping

## 2019-09-27 NOTE — Progress Notes (Signed)
Virtual Visit via Telephone Note  I connected with Freddi Starr on 09/27/19 at  1:20 PM EDT by telephone and verified that I am speaking with the correct person using two identifiers.   I discussed the limitations, risks, security and privacy concerns of performing an evaluation and management service by telephone and the availability of in person appointments. I also discussed with the patient that there may be a patient responsible charge related to this service. The patient expressed understanding and agreed to proceed.  Patient location: at home Provider loccation: In office   Subjective:    CC: sinus congeestion   HPI: Was doing yard work prior to symptoms.  Symptoms Started 2 weeks ago.  She complains of Congestion/drainage Cough due to drainage - worse in AM No fever, chills or sweats. Sinus pressure Teeth ache No sore throat, no other symptoms  Has tried Sudafed, Dayquil, nasal sprays OTC (mostly saline - nothing helping    Past medical history, Surgical history, Family history not pertinant except as noted below, Social history, Allergies, and medications have been entered into the medical record, reviewed, and corrections made.   Review of Systems: No fevers, chills, night sweats, weight loss, chest pain, or shortness of breath.   Objective:    General: Speaking clearly in complete sentences without any shortness of breath.  Alert and oriented x3.  Normal judgment. No apparent acute distress.    Impression and Recommendations:     Acute sinusitis-we will go ahead and treat with Augmentin.  Did recommend that she go ahead and start over-the-counter Flonase especially since she feels like her Allegra that she is taken for years has not been working as well in general.  If not improving consider round of prednisone as well.  She did note that she did have her Covid vaccination.     I discussed the assessment and treatment plan with the patient. The patient was  provided an opportunity to ask questions and all were answered. The patient agreed with the plan and demonstrated an understanding of the instructions.   The patient was advised to call back or seek an in-person evaluation if the symptoms worsen or if the condition fails to improve as anticipated.  I provided 18 minutes of non-face-to-face time during this encounter.   Beatrice Lecher, MD

## 2019-12-04 DIAGNOSIS — M2391 Unspecified internal derangement of right knee: Secondary | ICD-10-CM | POA: Diagnosis not present

## 2019-12-04 DIAGNOSIS — M94261 Chondromalacia, right knee: Secondary | ICD-10-CM | POA: Diagnosis not present

## 2019-12-05 ENCOUNTER — Encounter: Payer: Self-pay | Admitting: Family Medicine

## 2019-12-05 ENCOUNTER — Other Ambulatory Visit: Payer: Self-pay

## 2019-12-05 ENCOUNTER — Ambulatory Visit (INDEPENDENT_AMBULATORY_CARE_PROVIDER_SITE_OTHER): Payer: BC Managed Care – PPO | Admitting: Family Medicine

## 2019-12-05 DIAGNOSIS — J0100 Acute maxillary sinusitis, unspecified: Secondary | ICD-10-CM | POA: Diagnosis not present

## 2019-12-05 MED ORDER — PREDNISONE 20 MG PO TABS
20.0000 mg | ORAL_TABLET | Freq: Two times a day (BID) | ORAL | 0 refills | Status: AC
Start: 2019-12-05 — End: 2019-12-10

## 2019-12-05 MED ORDER — AMOXICILLIN-POT CLAVULANATE 875-125 MG PO TABS
1.0000 | ORAL_TABLET | Freq: Two times a day (BID) | ORAL | 0 refills | Status: DC
Start: 2019-12-05 — End: 2020-03-18

## 2019-12-05 NOTE — Progress Notes (Signed)
Megan Chavez - 44 y.o. female MRN [id number]  Date of birth: 16-Mar-1976  Subjective Chief Complaint  Patient presents with  . Tinnitus  . Nasal Congestion    HPI Megan Chavez is a 44 y.o. female here today with complaint of ear and sinus pain/pressure.  She reports that she started having pressure in bilateral ears about 10 days ago. Symptoms have worsened and now include sinus pain with pain in her upper teeth and gums.  She also has ringing sensation in her ears.  She denies dizziness, fever, chills, shortness of breath, headache or body aches. She has tried saline and flonase nasal sprays, decongestants, and antihistamines without much improvement.   ROS:  A comprehensive ROS was completed and negative except as noted per HPI   Allergies  Allergen Reactions  . Molds & Smuts     Other reaction(s): Cough, Cough (ALLERGY/intolerance)  . Other Other (See Comments)    Other reaction(s): Other    Past Medical History:  Diagnosis Date  . Hypothyroidism due to Hashimoto's thyroiditis 03/04/2017    Past Surgical History:  Procedure Laterality Date  . CESAREAN SECTION  10/2000  . CESAREAN SECTION  02/2003  . CESAREAN SECTION  06/2006    Social History   Socioeconomic History  . Marital status: Married    Spouse name: Nyah Shepherd  . Number of children: 3  . Years of education: Not on file  . Highest education level: Not on file  Occupational History  . Occupation: Biochemist, clinical    Comment: [employer]   Tobacco Use  . Smoking status: Former Smoker    Quit date: 03/10/2000    Years since quitting: 19.7  . Smokeless tobacco: Never Used  Substance and Sexual Activity  . Alcohol use: Yes    Comment: 0-1  . Drug use: No  . Sexual activity: Yes  Other Topics Concern  . Not on file  Social History Narrative   She exercises 2-3 days/week for about 30 minutes.  2 caffeinated beverages daily.   Social Determinants of Health   Financial Resource Strain:   .  Difficulty of Paying Living Expenses:   Food Insecurity:   . Worried About Charity fundraiser in the Last Year:   . Arboriculturist in the Last Year:   Transportation Needs:   . Film/video editor (Medical):   Marland Kitchen Lack of Transportation (Non-Medical):   Physical Activity:   . Days of Exercise per Week:   . Minutes of Exercise per Session:   Stress:   . Feeling of Stress :   Social Connections:   . Frequency of Communication with Friends and Family:   . Frequency of Social Gatherings with Friends and Family:   . Attends Religious Services:   . Active Member of Clubs or Organizations:   . Attends Archivist Meetings:   Marland Kitchen Marital Status:     Family History  Problem Relation Age of Onset  . Stroke Father   . Hypertension Father   . Hashimoto's thyroiditis Sister   . Leukemia Daughter     Health Maintenance  Topic Date Due  . Hepatitis C Screening  Never done  . INFLUENZA VACCINE  12/02/2019  . HIV Screening  04/02/2020 (Originally 09/20/1989)  . PAP SMEAR-Modifier  04/02/2024  . TETANUS/TDAP  07/19/2027  . COVID-19 Vaccine  Completed     ----------------------------------------------------------------------------------------------------------------------------------------------------------------------------------------------------------------- Physical Exam BP (!) 144/78 (BP Location: Left Arm, Patient Position: Sitting, Cuff Size: Normal)   Pulse  85   Temp 98.5 F (36.9 C) (Temporal)   Wt 189 lb 1.6 oz (85.8 kg)   SpO2 99%   BMI 30.52 kg/m   Physical Exam Constitutional:      Appearance: Normal appearance.  HENT:     Right Ear: Tympanic membrane normal.     Ears:     Comments: Serous effusion present    Nose:     Comments: TTP along bilateral maxillary sinuses    Mouth/Throat:     Mouth: Mucous membranes are moist.  Eyes:     General: No scleral icterus. Cardiovascular:     Rate and Rhythm: Normal rate and regular rhythm.  Pulmonary:      Effort: Pulmonary effort is normal.     Breath sounds: Normal breath sounds.  Musculoskeletal:     Cervical back: Neck supple.  Neurological:     General: No focal deficit present.     Mental Status: She is alert.  Psychiatric:        Mood and Affect: Mood normal.        Behavior: Behavior normal.     ------------------------------------------------------------------------------------------------------------------------------------------------------------------------------------------------------------------- Assessment and Plan  Acute maxillary sinusitis Will treat aggressively with augmentin and prednisone 82m BID x5 days.  She will increase fluid intake She may continue OTC medications as needed for symptom management.  Instructed to follow up if not improving with prescribed treatment.    Meds ordered this encounter  Medications  . predniSONE (DELTASONE) 20 MG tablet    Sig: Take 1 tablet (20 mg total) by mouth 2 (two) times daily with a meal for 5 days.    Dispense:  10 tablet    Refill:  0  . amoxicillin-clavulanate (AUGMENTIN) 875-125 MG tablet    Sig: Take 1 tablet by mouth 2 (two) times daily.    Dispense:  20 tablet    Refill:  0    No follow-ups on file.    This visit occurred during the SARS-CoV-2 public health emergency.  Safety protocols were in place, including screening questions prior to the visit, additional usage of staff PPE, and extensive cleaning of exam room while observing appropriate contact time as indicated for disinfecting solutions.

## 2019-12-05 NOTE — Patient Instructions (Signed)

## 2019-12-05 NOTE — Assessment & Plan Note (Signed)
Will treat aggressively with augmentin and prednisone 50m BID x5 days.  She will increase fluid intake She may continue OTC medications as needed for symptom management.  Instructed to follow up if not improving with prescribed treatment.

## 2020-01-02 DIAGNOSIS — Z20822 Contact with and (suspected) exposure to covid-19: Secondary | ICD-10-CM | POA: Diagnosis not present

## 2020-01-15 DIAGNOSIS — M2391 Unspecified internal derangement of right knee: Secondary | ICD-10-CM | POA: Diagnosis not present

## 2020-02-11 DIAGNOSIS — M2391 Unspecified internal derangement of right knee: Secondary | ICD-10-CM | POA: Diagnosis not present

## 2020-02-11 DIAGNOSIS — R600 Localized edema: Secondary | ICD-10-CM | POA: Diagnosis not present

## 2020-02-29 DIAGNOSIS — M94261 Chondromalacia, right knee: Secondary | ICD-10-CM | POA: Diagnosis not present

## 2020-03-18 ENCOUNTER — Encounter: Payer: Self-pay | Admitting: Family Medicine

## 2020-03-18 ENCOUNTER — Telehealth (INDEPENDENT_AMBULATORY_CARE_PROVIDER_SITE_OTHER): Payer: BC Managed Care – PPO | Admitting: Family Medicine

## 2020-03-18 ENCOUNTER — Telehealth: Payer: BC Managed Care – PPO | Admitting: Family Medicine

## 2020-03-18 VITALS — Ht 66.0 in | Wt 182.0 lb

## 2020-03-18 DIAGNOSIS — J0100 Acute maxillary sinusitis, unspecified: Secondary | ICD-10-CM

## 2020-03-18 MED ORDER — AMOXICILLIN-POT CLAVULANATE 875-125 MG PO TABS
1.0000 | ORAL_TABLET | Freq: Two times a day (BID) | ORAL | 0 refills | Status: DC
Start: 2020-03-18 — End: 2020-11-13

## 2020-03-18 NOTE — Progress Notes (Signed)
Virtual Visit via Telephone Note  I connected with Megan Chavez on 03/18/20 at  2:40 PM EST by telephone and verified that I am speaking with the correct person using two identifiers.   I discussed the limitations, risks, security and privacy concerns of performing an evaluation and management service by telephone and the availability of in person appointments. I also discussed with the patient that there may be a patient responsible charge related to this service. The patient expressed understanding and agreed to proceed.  Patient location: at home Provider loccation: In office   Subjective:    CC: sinus sxs  HPI:  She reports that she has had sxs for about 2 wks.  Initially it started as just a lot of nasal congestion and drainage she actually started her Allegra they had pulled out some Halloween decorations as well and they were pretty dusty.  But over the last week it seems to have progressed into feeling of the left side of her face and forehead is very plugged.  She is also been getting some ear fullness and ringing as well. And L sided pain in her forehead at the sinus areas. She has some ringing in her L ear and feels that like there is pressure behind her ear.   She stated that she has been using dayquil, and saline rinses and these have not worked for her. She denies any f/s/c/n/v/d/bodyaches or headaches. She did inform me that she has dry heat in her house and that she has not have a humidifier.   She has been using some nasal saline irrigation which does provide some temporary relief.  She will be boarding a plane for [place] on Saturday and was hoping to get something to help with this.   She also wanted to know about getting a Covid booster.  She had the [employer] vaccine a little over 6 months ago.   Past medical history, Surgical history, Family history not pertinant except as noted below, Social history, Allergies, and medications have been entered into the  medical record, reviewed, and corrections made.   Review of Systems: No fevers, chills, night sweats, weight loss, chest pain, or shortness of breath.   Objective:    General: Speaking clearly in complete sentences without any shortness of breath.  Alert and oriented x3.  Normal judgment. No apparent acute distress.    Impression and Recommendations:    Acute sinusitis, left maxillary-symptoms started initially 2 weeks ago and over the last week have progressed into left-sided maxillary and frontal pain with feeling like she is completely plugged on that side of her face.  No fevers or chills or worsening symptoms.  We will go ahead and treat with Augmentin and encouraged her to start her Flonase which she does have at home.  Continue with nasal saline irrigation.  Call if not improving or if sinus or ear pain worsens.    I discussed the assessment and treatment plan with the patient. The patient was provided an opportunity to ask questions and all were answered. The patient agreed with the plan and demonstrated an understanding of the instructions.   The patient was advised to call back or seek an in-person evaluation if the symptoms worsen or if the condition fails to improve as anticipated.  I provided 16 minutes of non-face-to-face time during this encounter.   Beatrice Lecher, MD

## 2020-03-18 NOTE — Progress Notes (Signed)
She reports that she has had sxs for about 2 wks  And L sided pain in her forehead at the sinus areas. She has some ringing in her L ear and feels that like there is pressure behind her ear.   She stated that she has been using dayquil, and saline rinses and these have not worked for her. She denies any f/s/c/n/v/d/bodyaches or headaches. She did inform me that she has dry heat in her house and that she has not have a humidifier.   She will be boarding a plane for [place] on Saturday and was hoping to get something to help with this.

## 2020-04-14 DIAGNOSIS — J329 Chronic sinusitis, unspecified: Secondary | ICD-10-CM | POA: Diagnosis not present

## 2020-04-14 DIAGNOSIS — J309 Allergic rhinitis, unspecified: Secondary | ICD-10-CM | POA: Diagnosis not present

## 2020-05-07 ENCOUNTER — Other Ambulatory Visit: Payer: Self-pay

## 2020-05-07 ENCOUNTER — Encounter: Payer: Self-pay | Admitting: Family Medicine

## 2020-05-07 ENCOUNTER — Ambulatory Visit (INDEPENDENT_AMBULATORY_CARE_PROVIDER_SITE_OTHER): Payer: BC Managed Care – PPO

## 2020-05-07 ENCOUNTER — Ambulatory Visit (INDEPENDENT_AMBULATORY_CARE_PROVIDER_SITE_OTHER): Payer: BC Managed Care – PPO | Admitting: Family Medicine

## 2020-05-07 VITALS — BP 131/80 | HR 77 | Ht 66.0 in | Wt 189.0 lb

## 2020-05-07 DIAGNOSIS — R519 Headache, unspecified: Secondary | ICD-10-CM

## 2020-05-07 DIAGNOSIS — J329 Chronic sinusitis, unspecified: Secondary | ICD-10-CM

## 2020-05-07 DIAGNOSIS — Z23 Encounter for immunization: Secondary | ICD-10-CM | POA: Diagnosis not present

## 2020-05-07 NOTE — Progress Notes (Signed)
Acute Office Visit  Subjective:    Patient ID: Megan Chavez, female    DOB: 04-26-1976, 45 y.o.   MRN: [id number]  Chief Complaint  Patient presents with  . Ear Fullness    L ear x 3 days  . Facial Pain    L sided behind eye and into teeth    HPI Patient is in today for left ear pain and ringing.  She says that she has also had a lot of pain on the left side of her face over the maxillary area as well as her eyebrow ridge.  She says is really been there for months it is waxed and waned in intensity.  She says has been treated for multiple sinusitis infections this year and she says she will get temporary relief but then it always seems to come right back.  Now she has been getting ear ringing more recently and its been frustrating its been more persistent.  She says she takes Human resources officer daily.  She tried Flonase for a while, nasal saline, decongestants, expectorants etc. without any significant relief.  Past Medical History:  Diagnosis Date  . Hypothyroidism due to Hashimoto's thyroiditis 03/04/2017    Past Surgical History:  Procedure Laterality Date  . CESAREAN SECTION  10/2000  . CESAREAN SECTION  02/2003  . CESAREAN SECTION  06/2006    Family History  Problem Relation Age of Onset  . Stroke Father   . Hypertension Father   . Hashimoto's thyroiditis Sister   . Leukemia Daughter     Social History   Socioeconomic History  . Marital status: Married    Spouse name: Quaniyah Bugh  . Number of children: 3  . Years of education: Not on file  . Highest education level: Not on file  Occupational History  . Occupation: Biochemist, clinical    Comment: [employer]   Tobacco Use  . Smoking status: Former Smoker    Quit date: 03/10/2000    Years since quitting: 20.1  . Smokeless tobacco: Never Used  Substance and Sexual Activity  . Alcohol use: Yes    Comment: 0-1  . Drug use: No  . Sexual activity: Yes  Other Topics Concern  . Not on file  Social History Narrative    She exercises 2-3 days/week for about 30 minutes.  2 caffeinated beverages daily.   Social Determinants of Health   Financial Resource Strain: Not on file  Food Insecurity: Not on file  Transportation Needs: Not on file  Physical Activity: Not on file  Stress: Not on file  Social Connections: Not on file  Intimate Partner Violence: Not on file    Outpatient Medications Prior to Visit  Medication Sig Dispense Refill  . amoxicillin-clavulanate (AUGMENTIN) 875-125 MG tablet Take 1 tablet by mouth 2 (two) times daily. 20 tablet 0  . fexofenadine (ALLEGRA) 180 MG tablet Take 180 mg by mouth daily.    Marland Kitchen levothyroxine (SYNTHROID) 75 MCG tablet Take 1 tablet by mouth daily.     No facility-administered medications prior to visit.    Allergies  Allergen Reactions  . Molds & Smuts     Other reaction(s): Cough, Cough (ALLERGY/intolerance)  . Other Other (See Comments)    Other reaction(s): Other    Review of Systems     Objective:    Physical Exam Constitutional:      Appearance: She is well-developed and well-nourished.  HENT:     Head: Normocephalic and atraumatic.     Right Ear: External  ear normal.     Left Ear: External ear normal.     Nose: Nose normal.     Mouth/Throat:     Mouth: Oropharynx is clear and moist.      Comments: TMs and canals are clear. Eyes:     Extraocular Movements: EOM normal.     Conjunctiva/sclera: Conjunctivae normal.     Pupils: Pupils are equal, round, and reactive to light.  Neck:     Thyroid: No thyromegaly.  Cardiovascular:     Rate and Rhythm: Normal rate and regular rhythm.     Heart sounds: Normal heart sounds.  Pulmonary:     Effort: Pulmonary effort is normal.     Breath sounds: Normal breath sounds. No wheezing.  Musculoskeletal:     Cervical back: Neck supple.  Lymphadenopathy:     Cervical: No cervical adenopathy.  Skin:    General: Skin is warm and dry.  Neurological:     Mental Status: She is alert and oriented to  person, place, and time.  Psychiatric:        Mood and Affect: Mood and affect normal.     BP 131/80   Pulse 77   Ht 5' 6"  (1.676 m)   Wt 189 lb (85.7 kg)   SpO2 100%   BMI 30.51 kg/m  Wt Readings from Last 3 Encounters:  05/07/20 189 lb (85.7 kg)  03/18/20 182 lb (82.6 kg)  12/05/19 189 lb 1.6 oz (85.8 kg)    Health Maintenance Due  Topic Date Due  . Hepatitis C Screening  Never done  . HIV Screening  Never done  . COLONOSCOPY (Pts 45-68yr Insurance coverage will need to be confirmed)  Never done    There are no preventive care reminders to display for this patient.   No results found for: TSH Lab Results  Component Value Date   WBC 4.4 04/03/2019   HGB 13.1 04/03/2019   HCT 38.6 04/03/2019   MCV 86.0 04/03/2019   PLT 292 04/03/2019   Lab Results  Component Value Date   NA 139 04/03/2019   K 4.5 04/03/2019   CO2 27 04/03/2019   GLUCOSE 95 04/03/2019   BUN 15 04/03/2019   CREATININE 0.84 04/03/2019   BILITOT 0.5 04/03/2019   AST 15 04/03/2019   ALT 14 04/03/2019   PROT 6.7 04/03/2019   CALCIUM 9.3 04/03/2019   Lab Results  Component Value Date   CHOL 214 (H) 04/03/2019   Lab Results  Component Value Date   HDL 62 04/03/2019   Lab Results  Component Value Date   LDLCALC 135 (H) 04/03/2019   Lab Results  Component Value Date   TRIG 78 04/03/2019   Lab Results  Component Value Date   CHOLHDL 3.5 04/03/2019   No results found for: HGBA1C     Assessment & Plan:   Problem List Items Addressed This Visit   None   Visit Diagnoses    Need for immunization against influenza    -  Primary   Relevant Orders   Flu Vaccine QUAD 36+ mos IM (Completed)   Left facial pain       Relevant Orders   CT Maxillofacial WO CM   CT MAXILLOFACIAL LTD WO CM (Completed)   Recurrent sinusitis       Relevant Orders   CT Maxillofacial WO CM   CT MAXILLOFACIAL LTD WO CM (Completed)     Left facial pain and pressure with ear pain and pressure as well  going  on for several weeks to relieve and months.  Suspect that she may have an underlying chronic sinusitis.  Discussed further work-up with CT maxillofacial.  If negative then recommend referral to ENT to look for other causes and may be even referral to allergy immunology she does have a history of dust mite allergies that were tested back when she was a kid.  But I think both of these could be helpful.  The meantime recommend continue nasal steroid spray as there are good studies showing reduction in the number of yearly sinus infections in people who use the medication chronically.  No orders of the defined types were placed in this encounter.    Beatrice Lecher, MD

## 2020-05-08 ENCOUNTER — Encounter: Payer: Self-pay | Admitting: Family Medicine

## 2020-05-08 DIAGNOSIS — J329 Chronic sinusitis, unspecified: Secondary | ICD-10-CM

## 2020-05-08 DIAGNOSIS — R519 Headache, unspecified: Secondary | ICD-10-CM

## 2020-05-08 NOTE — Telephone Encounter (Signed)
Referral sent to ENT. FYI

## 2020-05-21 DIAGNOSIS — J329 Chronic sinusitis, unspecified: Secondary | ICD-10-CM | POA: Diagnosis not present

## 2020-05-21 DIAGNOSIS — J301 Allergic rhinitis due to pollen: Secondary | ICD-10-CM | POA: Diagnosis not present

## 2020-05-21 DIAGNOSIS — J342 Deviated nasal septum: Secondary | ICD-10-CM | POA: Diagnosis not present

## 2020-06-03 DIAGNOSIS — E038 Other specified hypothyroidism: Secondary | ICD-10-CM | POA: Diagnosis not present

## 2020-06-03 DIAGNOSIS — E063 Autoimmune thyroiditis: Secondary | ICD-10-CM | POA: Diagnosis not present

## 2020-06-03 DIAGNOSIS — Z79890 Hormone replacement therapy (postmenopausal): Secondary | ICD-10-CM | POA: Diagnosis not present

## 2020-11-13 ENCOUNTER — Telehealth (INDEPENDENT_AMBULATORY_CARE_PROVIDER_SITE_OTHER): Payer: 59 | Admitting: Physician Assistant

## 2020-11-13 ENCOUNTER — Encounter: Payer: Self-pay | Admitting: Physician Assistant

## 2020-11-13 VITALS — Temp 98.1°F | Ht 66.0 in | Wt 189.0 lb

## 2020-11-13 DIAGNOSIS — R0982 Postnasal drip: Secondary | ICD-10-CM | POA: Diagnosis not present

## 2020-11-13 DIAGNOSIS — R059 Cough, unspecified: Secondary | ICD-10-CM

## 2020-11-13 DIAGNOSIS — J9801 Acute bronchospasm: Secondary | ICD-10-CM | POA: Diagnosis not present

## 2020-11-13 DIAGNOSIS — U071 COVID-19: Secondary | ICD-10-CM

## 2020-11-13 MED ORDER — IPRATROPIUM BROMIDE 0.06 % NA SOLN: 2.0000 | Freq: Four times a day (QID) | NASAL | 0 refills | Status: DC | PRN

## 2020-11-13 MED ORDER — ALBUTEROL SULFATE HFA 108 (90 BASE) MCG/ACT IN AERS: 1.0000 | INHALATION_SPRAY | RESPIRATORY_TRACT | 0 refills | Status: DC | PRN

## 2020-11-13 MED ORDER — BENZONATATE 200 MG PO CAPS: 200.0000 mg | ORAL_CAPSULE | Freq: Three times a day (TID) | ORAL | 0 refills | Status: DC | PRN

## 2020-11-13 NOTE — Patient Instructions (Signed)
10 Things You Can Do to Manage Your COVID-19 Symptoms at Home If you have possible or confirmed COVID-19 Stay home except to get medical care. Monitor your symptoms carefully. If your symptoms get worse, call your healthcare provider immediately. Get rest and stay hydrated. If you have a medical appointment, call the healthcare provider ahead of time and tell them that you have or may have COVID-19. For medical emergencies, call 911 and notify the dispatch personnel that you have or may have COVID-19. Cover your cough and sneezes with a tissue or use the inside of your elbow. Wash your hands often with soap and water for at least 20 seconds or clean your hands with an alcohol-based hand sanitizer that contains at least 60% alcohol. As much as possible, stay in a specific room and away from other people in your home. Also, you should use a separate bathroom, if available. If you need to be around other people in or outside of the home, wear a mask. Avoid sharing personal items with other people in your household, like dishes, towels, and bedding. Clean all surfaces that are touched often, like counters, tabletops, and doorknobs. Use household cleaning sprays or wipes according to the label instructions. cdc.gov/coronavirus 11/16/2019 This information is not intended to replace advice given to you by your health care provider. Make sure you discuss any questions you have with your healthcare provider. Document Revised: 06/06/2020 Document Reviewed: 06/06/2020 Elsevier Patient Education  2022 Elsevier Inc.  

## 2020-11-13 NOTE — Progress Notes (Signed)
Telemedicine Visit via  Video & Audio (App used: Chartered loss adjuster) Audio only - telephone (patient preference /  technical difficulty with MyChart video application)  I connected with Megan Chavez on 11/13/20 at 11:07 AM  by phone or  telemedicine application as noted above  I verified that I am speaking with or regarding  the correct patient using two identifiers.  Participants: Myself, Nelson Chimes PA-C Patient: Megan Chavez   Patient is at home in [facility] I am in office at [facility]    I discussed the limitations of evaluation and management  by telemedicine and the availability of in person appointments.  The participant(s) above expressed understanding and  agreed to proceed with this appointment via telemedicine.       History of Present Illness: Megan Chavez is a 45 y.o. female who would like to discuss COVID-19 infection   She is on day 4 of illness Tested + on Monday 7/11 - developed sore throat, cough and tiredness States she has had nasal congestion since 7/04 that she attributes to seasonal allergies and treats with Allegra-D and Nasacort - she had multiple negative COVID tests between 7/04 and 7/11 Endorses coughing fits and a sensation that her throat is swollen due to post-nasal drip Cough is dry and spastic - Mucinex DM does not help In the past with other viral infections, asthma inhalers have been helpful Denies fever, lethargy/confusion, chest pain, DOE/SOB, lightheadedness, syncope She is immunized for COVID She has no risk factors for complications   Observations/Objective: Temp 98.1 F (36.7 C) (Oral)   Ht 5' 6"  (1.676 m)   Wt 189 lb (85.7 kg)   BMI 30.51 kg/m  BP Readings from Last 3 Encounters:  05/07/20 131/80  12/05/19 (!) 144/78  04/03/19 133/67   Exam limited by telehealth Gen: alert, appears fatigued, non-toxic, no distress Pulm: normal work of breathing,normal phonation, speaking in full sentences Psych: normal thought  content, normal behavior   Lab and Radiology Results No results found for this or any previous visit (from the past 72 hour(s)). No results found.     Assessment and Plan: 45 y.o. female with The primary encounter diagnosis was COVID-19 virus infection. Diagnoses of Bronchospasm, acute, Cough, and Postnasal discharge were also pertinent to this visit.  Symptom onset 11/10/20 Symptoms are mild-moderate Not a candidate for Paxlovid Counseled on supportive care Albuterol 1-2 puff Q4h prn bronchospasm Tessalon 200 mg tid prn cough Continue Allegra-D and Nasacort Atrovent nasal spray QID prn PND Counseled to isolate until 11/15/20 and then mask an additional 5 days Counseled on ER precautions Follow-up prn  PDMP not reviewed this encounter. No orders of the defined types were placed in this encounter.  Meds ordered this encounter  Medications   benzonatate (TESSALON) 200 MG capsule    Sig: Take 1 capsule (200 mg total) by mouth 3 (three) times daily as needed for cough.    Dispense:  20 capsule    Refill:  0    Order Specific Question:   Supervising Provider    Answer:   Emeterio Reeve [[id number]]   albuterol (VENTOLIN HFA) 108 (90 Base) MCG/ACT inhaler    Sig: Inhale 1-2 puffs into the lungs every 4 (four) hours as needed (bronchospasm).    Dispense:  1 each    Refill:  0    Order Specific Question:   Supervising Provider    Answer:   Emeterio Reeve [[id number]]   ipratropium (ATROVENT) 0.06 % nasal spray    Sig: Place 2  sprays into both nostrils 4 (four) times daily as needed for rhinitis (post-nasal drip).    Dispense:  15 mL    Refill:  0    Order Specific Question:   Supervising Provider    Answer:   Emeterio Reeve [[id number]]   Patient Instructions  10 Things You Can Do to Manage Your COVID-19 Symptoms at Home If you have possible or confirmed COVID-19 Stay home except to get medical care. Monitor your symptoms carefully. If your symptoms get worse, call your  healthcare provider immediately. Get rest and stay hydrated. If you have a medical appointment, call the healthcare provider ahead of time and tell them that you have or may have COVID-19. For medical emergencies, call 911 and notify the dispatch personnel that you have or may have COVID-19. Cover your cough and sneezes with a tissue or use the inside of your elbow. Wash your hands often with soap and water for at least 20 seconds or clean your hands with an alcohol-based hand sanitizer that contains at least 60% alcohol. As much as possible, stay in a specific room and away from other people in your home. Also, you should use a separate bathroom, if available. If you need to be around other people in or outside of the home, wear a mask. Avoid sharing personal items with other people in your household, like dishes, towels, and bedding. Clean all surfaces that are touched often, like counters, tabletops, and doorknobs. Use household cleaning sprays or wipes according to the label instructions. [email or web address].[email or web address] 11/16/2019 This information is not intended to replace advice given to you by your health care provider. Make sure you discuss any questions you have with your healthcare provider. Document Revised: 06/06/2020 Document Reviewed: 06/06/2020 Elsevier Patient Education  [employer].  Instructions sent via [place].   Follow Up Instructions: Return if symptoms worsen or fail to improve.    I discussed the assessment and treatment plan with the patient. The patient was provided an opportunity to ask questions and all were answered. The patient agreed with the plan and demonstrated an understanding of the instructions.   The patient was advised to call back or seek an in-person evaluation if any new concerns, if symptoms worsen or if the condition fails to improve as anticipated.  14 minutes of non-face-to-face time was provided during this  encounter.      . . . . . . . . . . . . . Marland Kitchen                   Historical information moved to improve visibility of documentation.  Past Medical History:  Diagnosis Date   Hypothyroidism due to Hashimoto's thyroiditis 03/04/2017   Past Surgical History:  Procedure Laterality Date   CESAREAN SECTION  10/2000   CESAREAN SECTION  02/2003   CESAREAN SECTION  06/2006   Social History   Tobacco Use   Smoking status: Former    Types: Cigarettes    Quit date: 03/10/2000    Years since quitting: 20.6   Smokeless tobacco: Never  Substance Use Topics   Alcohol use: Yes    Comment: 0-1   family history includes Hashimoto's thyroiditis in her sister; Hypertension in her father; Leukemia in her daughter; Stroke in her father.  Medications: Current Outpatient Medications  Medication Sig Dispense Refill   albuterol (VENTOLIN HFA) 108 (90 Base) MCG/ACT inhaler Inhale 1-2 puffs into the lungs every 4 (four) hours as needed (bronchospasm). 1 each 0  benzonatate (TESSALON) 200 MG capsule Take 1 capsule (200 mg total) by mouth 3 (three) times daily as needed for cough. 20 capsule 0   fexofenadine (ALLEGRA) 180 MG tablet Take 180 mg by mouth daily.     ipratropium (ATROVENT) 0.06 % nasal spray Place 2 sprays into both nostrils 4 (four) times daily as needed for rhinitis (post-nasal drip). 15 mL 0   levothyroxine (SYNTHROID) 75 MCG tablet Take 1 tablet by mouth daily.     No current facility-administered medications for this visit.   Allergies  Allergen Reactions   Molds & Smuts     Other reaction(s): Cough, Cough (ALLERGY/intolerance)   Other Other (See Comments)    Other reaction(s): Other     If phone visit, billing and coding can please add appropriate modifier if needed

## 2020-11-13 NOTE — Progress Notes (Signed)
Started Monday Sore throat Cough congestion  No fevers  Tested on Thursday -negative (husband was in contact with someone at work) Saturday - negative  Monday - positive  Taking Mucinex DM and sudafed for symptoms - helping some, causing congestion to clear but now having drainage which is making cough worse

## 2020-11-17 ENCOUNTER — Encounter: Payer: Self-pay | Admitting: Family Medicine

## 2020-11-17 ENCOUNTER — Telehealth (INDEPENDENT_AMBULATORY_CARE_PROVIDER_SITE_OTHER): Payer: 59 | Admitting: Family Medicine

## 2020-11-17 DIAGNOSIS — J0130 Acute sphenoidal sinusitis, unspecified: Secondary | ICD-10-CM | POA: Diagnosis not present

## 2020-11-17 MED ORDER — AMOXICILLIN-POT CLAVULANATE 875-125 MG PO TABS: 1.0000 | ORAL_TABLET | Freq: Two times a day (BID) | ORAL | 0 refills | Status: DC

## 2020-11-17 NOTE — Progress Notes (Signed)
Pt reports cough,congestion,hoarseness.   Mostly head congestion. Unsure if she could have sinusitis. She states that her teeth and sinuses hurt. No fever. No headache, just a lot of pressure.  The Atrovent nasal spray caused her nose to burn so she d/c'd and started using a saline nasal spray and Nasacort.   She has a non productive cough, and is experiencing more post nasal drip.

## 2020-11-17 NOTE — Progress Notes (Signed)
Virtual Visit via Video Note  I connected with Megan Chavez on 11/17/20 at  2:40 PM EDT by a video enabled telemedicine application and verified that I am speaking with the correct person using two identifiers.   I discussed the limitations of evaluation and management by telemedicine and the availability of in person appointments. The patient expressed understanding and agreed to proceed.  Patient location: at home Provider location: in office  Subjective:    CC:   HPI: Has had nasal congestion since around July 2nd and then she tested positive on the 11th for COVID says the congestion is worse.  Says pressure over nasal bridge, constant drainage and cough. Her teeth ache. No fever.  No stomach upset. Not taking any meds today. Was taking Allegra D-. Using nasal saline nose spray.  + HA and bodyaches are much better.    Past medical history, Surgical history, Family history not pertinant except as noted below, Social history, Allergies, and medications have been entered into the medical record, reviewed, and corrections made.    Objective:    General: Speaking clearly in complete sentences without any shortness of breath.  Alert and oriented x3.  Normal judgment. No apparent acute distress.    Impression and Recommendations:    No problem-specific Assessment & Plan notes found for this encounter.  Acute sinusitis x 2 weeks - will Aguementin. Call if not better in one week.  Continue nasal saline. Stop decongestant and antihistamine.    COVID 19- discussed quarantine for 10 days. Call if any new or worsening sx. She is low rislk.     No orders of the defined types were placed in this encounter.   No orders of the defined types were placed in this encounter.    I discussed the assessment and treatment plan with the patient. The patient was provided an opportunity to ask questions and all were answered. The patient agreed with the plan and demonstrated an understanding of the  instructions.   The patient was advised to call back or seek an in-person evaluation if the symptoms worsen or if the condition fails to improve as anticipated.   Beatrice Lecher, MD

## 2020-11-30 ENCOUNTER — Encounter: Payer: Self-pay | Admitting: Family Medicine

## 2020-11-30 DIAGNOSIS — J3489 Other specified disorders of nose and nasal sinuses: Secondary | ICD-10-CM

## 2020-12-01 NOTE — Telephone Encounter (Signed)
Recommend Flonase or Nasonex it can take about 3 to 4 days to really kick in but it can reduce swelling in the sinuses and help with congestion also we can get a CT limited of the sinuses just to make sure there is not any residual infection but some of this may just be related to having had COVID especially if she completed the antibiotics and she is really not any better.

## 2020-12-03 DIAGNOSIS — E038 Other specified hypothyroidism: Secondary | ICD-10-CM | POA: Insufficient documentation

## 2020-12-03 LAB — HM MAMMOGRAPHY

## 2020-12-05 ENCOUNTER — Telehealth: Payer: Self-pay | Admitting: Family Medicine

## 2020-12-05 NOTE — Telephone Encounter (Signed)
We received notification from your insurance company that they will not pay for the CT unless you have had sinus symptoms for 12 weeks.  Let us know if the nasal steroid spray is helping if not maybe we can try another round of antibiotics in the meantime.

## 2020-12-08 NOTE — Telephone Encounter (Signed)
MyChart message sent to pt with information.

## 2021-01-14 ENCOUNTER — Encounter: Payer: Self-pay | Admitting: Family Medicine

## 2021-07-27 ENCOUNTER — Encounter: Payer: Self-pay | Admitting: Medical-Surgical

## 2021-07-27 ENCOUNTER — Telehealth: Payer: 59 | Admitting: Medical-Surgical

## 2021-07-27 DIAGNOSIS — J0140 Acute pansinusitis, unspecified: Secondary | ICD-10-CM

## 2021-07-27 MED ORDER — PREDNISONE 50 MG PO TABS: 50.0000 mg | ORAL_TABLET | Freq: Every day | ORAL | 0 refills | Status: DC

## 2021-07-27 MED ORDER — AMOXICILLIN-POT CLAVULANATE 875-125 MG PO TABS: 1.0000 | ORAL_TABLET | Freq: Two times a day (BID) | ORAL | 0 refills | Status: DC

## 2021-07-27 NOTE — Progress Notes (Signed)
Congestion started since the beginning of March. She's tried Sudafed PE and Nasal rinses.Nothing OTC is helping ? ?Head is full, when bends over her nose starts pouring, she is having bad headaches, and her ears clogged up ? ? ? ? ? ?

## 2021-07-27 NOTE — Progress Notes (Signed)
Virtual Visit via Video Note ? ?I connected with Megan Chavez on 07/27/21 at 11:10 AM EDT by a video enabled telemedicine application and verified that I am speaking with the correct person using two identifiers. ?  ?I discussed the limitations of evaluation and management by telemedicine and the availability of in person appointments. The patient expressed understanding and agreed to proceed. ? ?Patient location: home ?Provider locations: office ? ?Subjective:   ? ?CC: sinus symptoms ? ?HPI: ?Pleasant 46 year old female presenting via MyChart video visit with reports of significant sinus congestion for at least 3 weeks. Notes that she has had a worsening of the sinus congestion in the last week and now is having facial pain, dental pain, muted hearing, rhinorrhea, and PND. Has tried Flonase Sensimist, saline sprays/rinses, and Sudafed without relief. ? ?Past medical history, Surgical history, Family history not pertinant except as noted below, Social history, Allergies, and medications have been entered into the medical record, reviewed, and corrections made.  ? ?Review of Systems: See HPI for pertinent positives and negatives.  ? ?Objective:   ? ?General: Speaking clearly in complete sentences without any shortness of breath.  Alert and oriented x3.  Normal judgment. No apparent acute distress. ? ?Impression and Recommendations:   ? ?1. Acute non-recurrent pansinusitis ?Treating with Augmentin BID x 7 days. Offered Astelin/Atrovent nasal spray but she would prefer to hold off due to sensitivity to some nasal sprays. Sending in Prednisone burst with instructions to only start this if not significantly improving after 48-72 hours of antibiotic therapy.  ? ?I discussed the assessment and treatment plan with the patient. The patient was provided an opportunity to ask questions and all were answered. The patient agreed with the plan and demonstrated an understanding of the instructions. ?  ?The patient was advised to  call back or seek an in-person evaluation if the symptoms worsen or if the condition fails to improve as anticipated. ? ?20 minutes of non-face-to-face time was provided during this encounter. ? ?Return if symptoms worsen or fail to improve. ? ?Clearnce Sorrel, DNP, APRN, FNP-BC ?[place] ?Primary Care and Sports Medicine ?

## 2021-08-05 IMAGING — CT CT PARANASAL SINUSES LIMITED
3 of 4 series · 17 of 30 positions shown, 19 images · non-contrast
Comparison: None.

CLINICAL DATA: Maxillofacial pain

EXAM:
CT PARANASAL SINUS LIMITED WITHOUT CONTRAST
TECHNIQUE: Non-contiguous multidetector CT images of the paranasal sinuses were
obtained in a single plane without contrast.

[Series 2: limited sinus bone · axial · 0.28mm/px · z∈[-158,-78]mm · 5 of 13 slices shown (1 of 2)]
[im 3/13  bone]
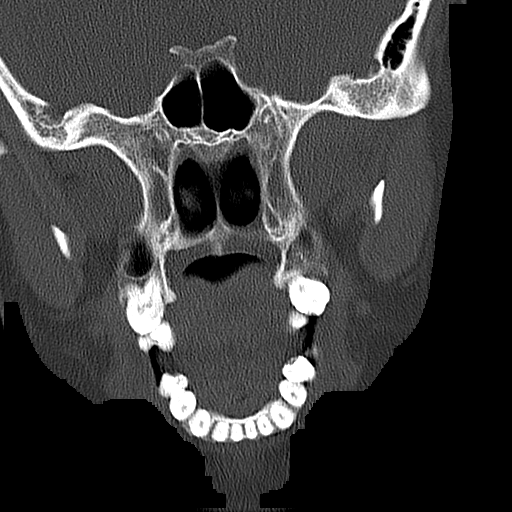
[im 5/13  bone]
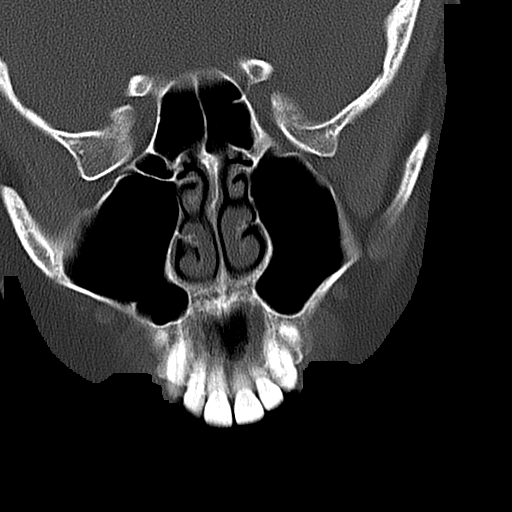
[im 7/13  bone]
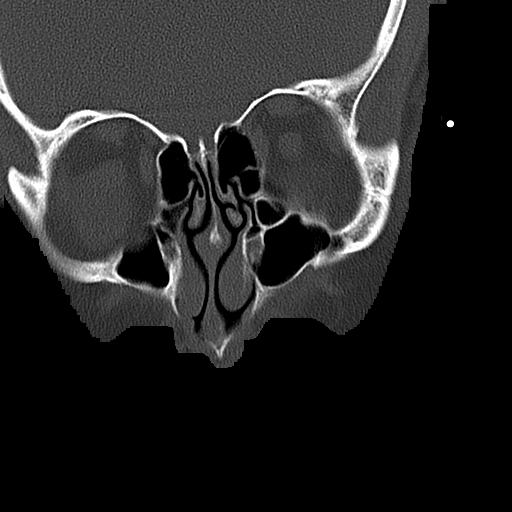
[im 9/13  bone]
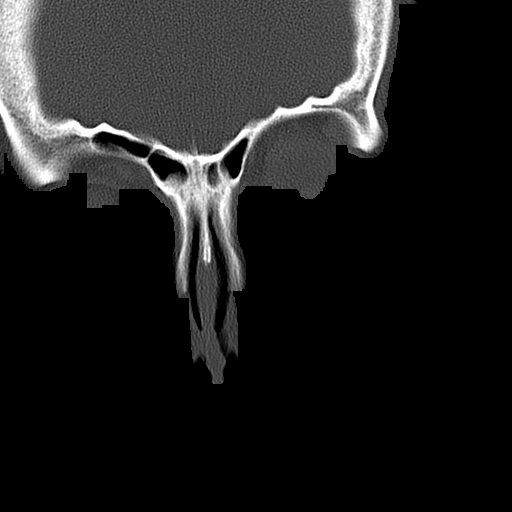
[im 11/13  bone]
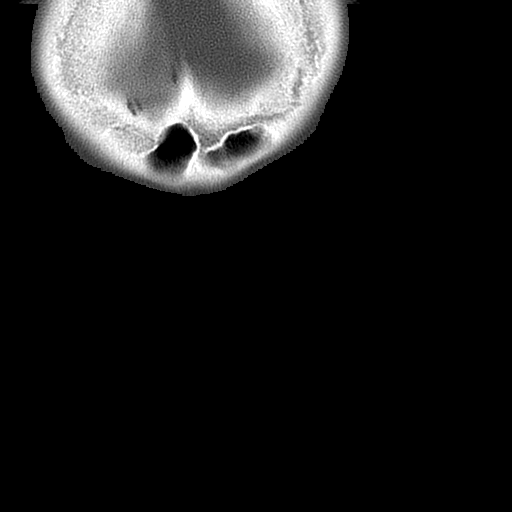

[Series 4: limited sinus bone · axial · 0.30mm/px · z∈[-164,-74]mm · 6 of 13 slices shown, 8 images (2 of 2)]
[im 2/13  brain]
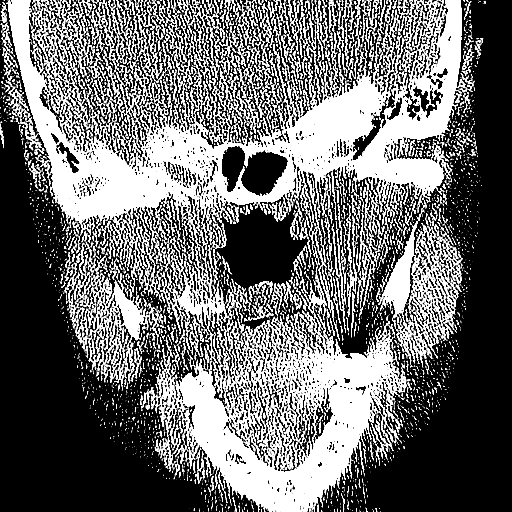
[im 2/13  bone]
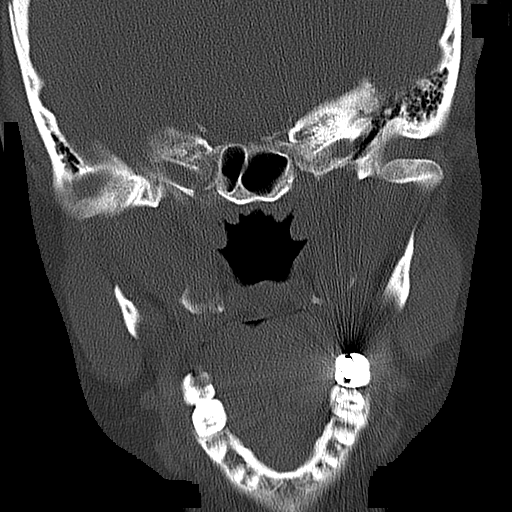
[im 4/13  bone]
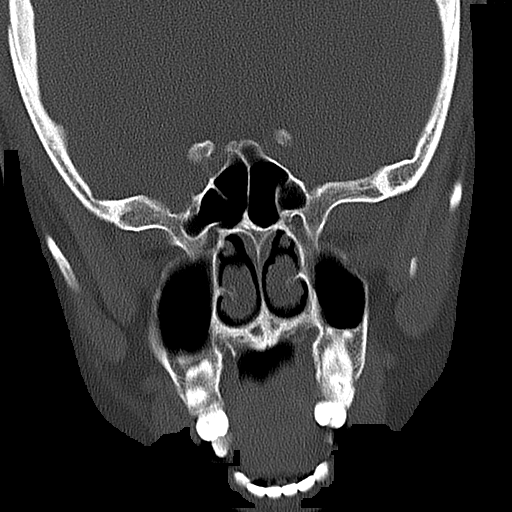
[im 6/13  bone]
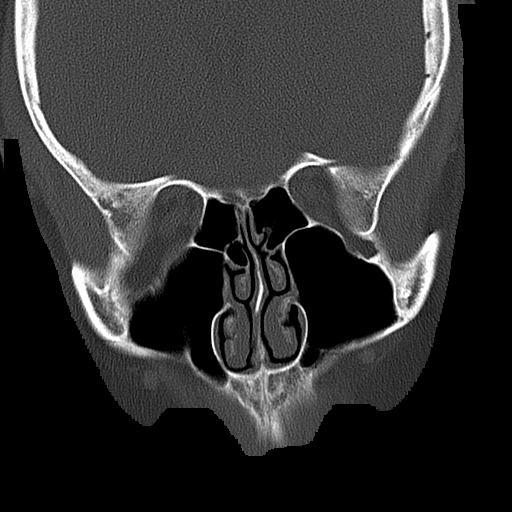
[im 7/13  bone]
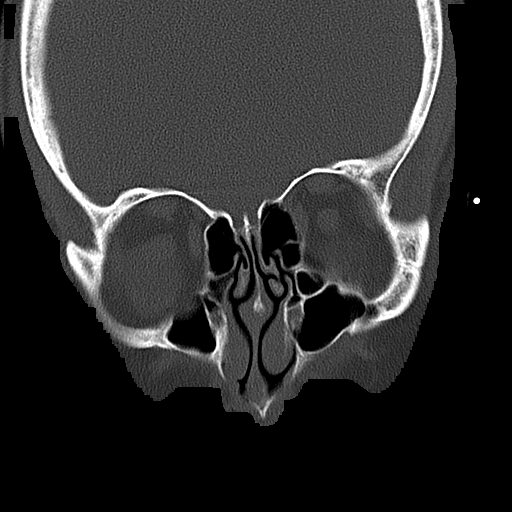
[im 9/13  brain]
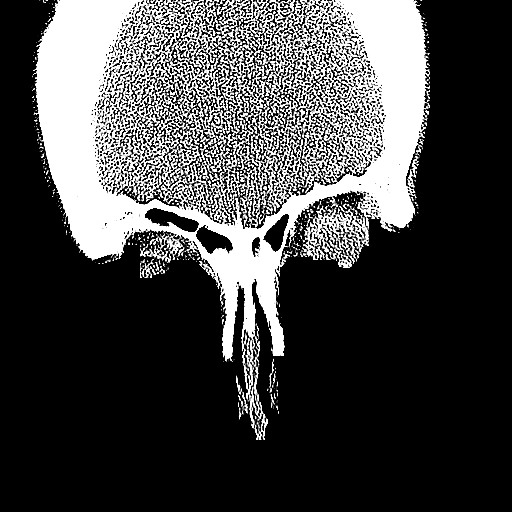
[im 9/13  bone]
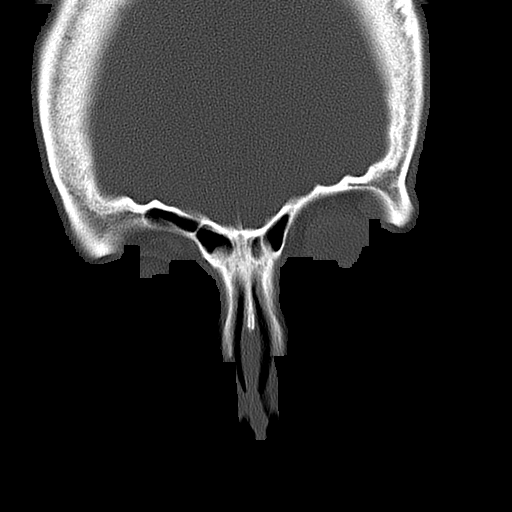
[im 11/13  bone]
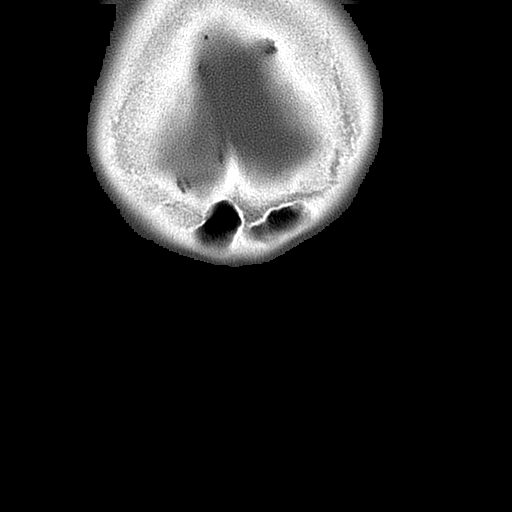

[Series 5: limited sinus st · axial · 0.30mm/px · z∈[-164,-74]mm · 6 of 13 slices shown]
[im 2/13  bone]
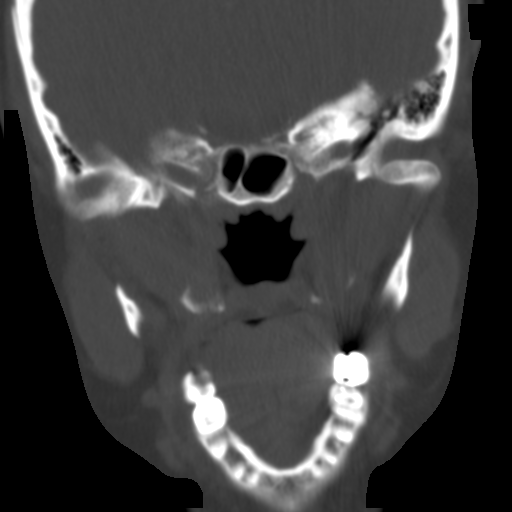
[im 4/13  bone]
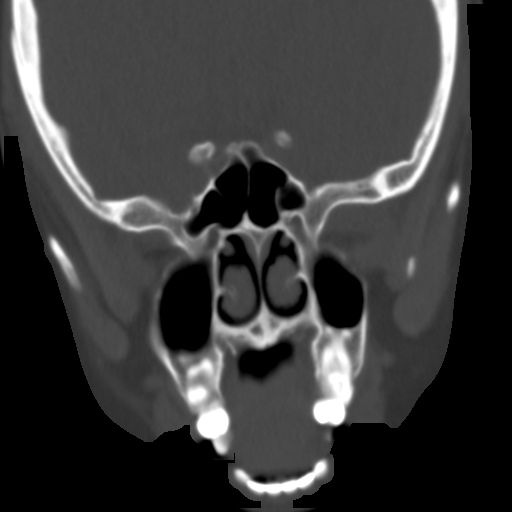
[im 6/13  bone]
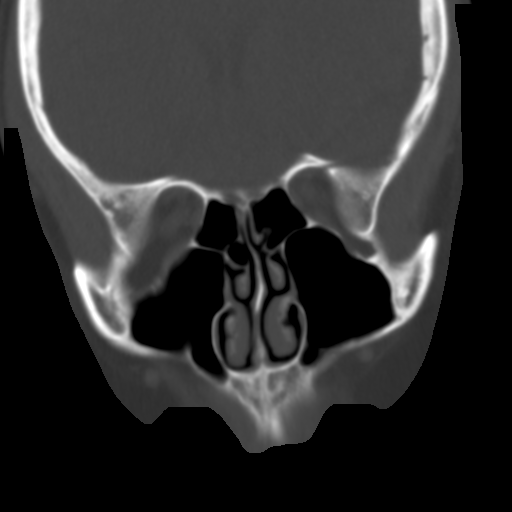
[im 7/13  bone]
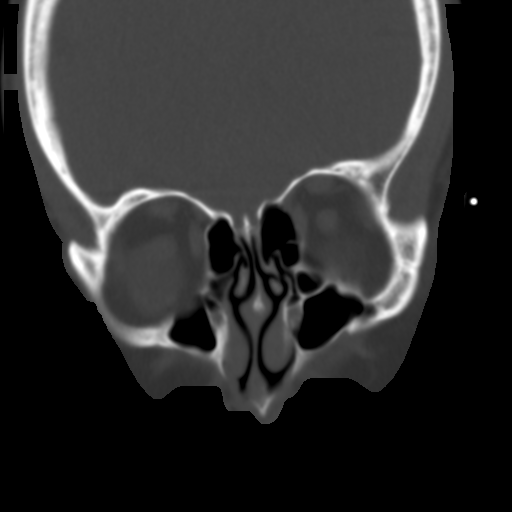
[im 9/13  bone]
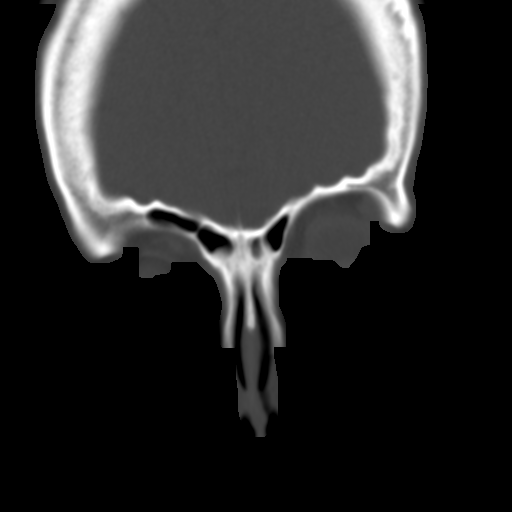
[im 11/13  bone]
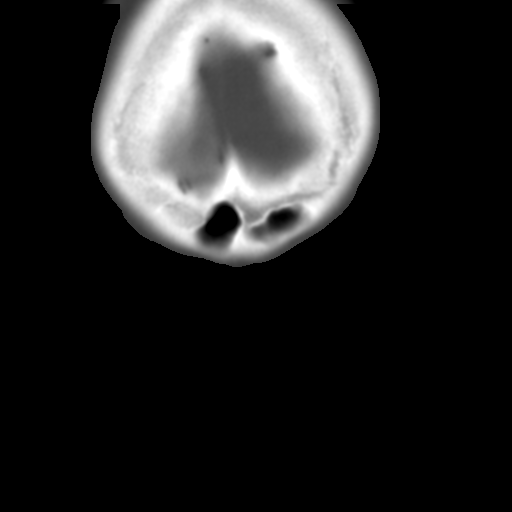

[17 of 30 positions shown; findings below may reference images not displayed]

FINDINGS: Paranasal sinuses:

Frontal: Normally aerated.

Ethmoid: Normally aerated.

Maxillary: Normally aerated.

Sphenoid: Normally aerated.

Right ostiomeatal unit: Grossly patent.

Left ostiomeatal unit: Grossly patent.

Nasal passages: Patent. Intact nasal septum with leftward deviation.

Anatomy: Symmetric and intact olfactory grooves and fovea
ethmoidalis, Keros II (4-7mm).

Other: Visual orbits and intracranial compartment are unremarkable.
Visible mastoid air cells are normally aerated.
IMPRESSION: Clear paranasal sinuses and sinonasal drainage pathways.

No CT finding to explain patient's symptoms.

## 2021-08-27 ENCOUNTER — Encounter: Payer: Self-pay | Admitting: Family Medicine

## 2021-08-27 DIAGNOSIS — Z0000 Encounter for general adult medical examination without abnormal findings: Secondary | ICD-10-CM

## 2021-08-27 DIAGNOSIS — E063 Autoimmune thyroiditis: Secondary | ICD-10-CM

## 2021-08-27 DIAGNOSIS — Z131 Encounter for screening for diabetes mellitus: Secondary | ICD-10-CM

## 2021-08-27 DIAGNOSIS — E7800 Pure hypercholesterolemia, unspecified: Secondary | ICD-10-CM

## 2021-08-27 NOTE — Telephone Encounter (Signed)
Labs pended, sign if appropriate.  ?

## 2021-08-29 LAB — COMPLETE METABOLIC PANEL WITH GFR
AG Ratio: 1.7 (calc) (ref 1.0–2.5)
ALT: 17 U/L (ref 6–29)
AST: 15 U/L (ref 10–35)
Albumin: 3.9 g/dL (ref 3.6–5.1)
Alkaline phosphatase (APISO): 82 U/L (ref 31–125)
BUN: 11 mg/dL (ref 7–25)
CO2: 28 mmol/L (ref 20–32)
Calcium: 9 mg/dL (ref 8.6–10.2)
Chloride: 105 mmol/L (ref 98–110)
Creat: 0.75 mg/dL (ref 0.50–0.99)
Globulin: 2.3 g/dL (calc) (ref 1.9–3.7)
Glucose, Bld: 96 mg/dL (ref 65–139)
Potassium: 3.8 mmol/L (ref 3.5–5.3)
Sodium: 140 mmol/L (ref 135–146)
Total Bilirubin: 0.4 mg/dL (ref 0.2–1.2)
Total Protein: 6.2 g/dL (ref 6.1–8.1)
eGFR: 99 mL/min/{1.73_m2} (ref >=60)

## 2021-08-29 LAB — CBC WITH DIFFERENTIAL/PLATELET
Absolute Monocytes: 428 cells/uL (ref 200–950)
Basophils Absolute: 41 cells/uL (ref 0–200)
Basophils Relative: 0.9 %
Eosinophils Absolute: 59 cells/uL (ref 15–500)
Eosinophils Relative: 1.3 %
HCT: 37.6 % (ref 35.0–45.0)
Hemoglobin: 12.1 g/dL (ref 11.7–15.5)
Lymphs Abs: 1157 cells/uL (ref 850–3900)
MCH: 27 pg (ref 27.0–33.0)
MCHC: 32.2 g/dL (ref 32.0–36.0)
MCV: 83.9 fL (ref 80.0–100.0)
MPV: 10.5 fL (ref 7.5–12.5)
Monocytes Relative: 9.5 %
Neutro Abs: 2817 cells/uL (ref 1500–7800)
Neutrophils Relative %: 62.6 %
Platelets: 291 10*3/uL (ref 140–400)
RBC: 4.48 10*6/uL (ref 3.80–5.10)
RDW: 14.1 % (ref 11.0–15.0)
Total Lymphocyte: 25.7 %
WBC: 4.5 10*3/uL (ref 3.8–10.8)

## 2021-08-29 LAB — LIPID PANEL W/REFLEX DIRECT LDL
Cholesterol: 197 mg/dL (ref <200)
HDL: 61 mg/dL (ref >=50)
LDL Cholesterol (Calc): 108 mg/dL (calc) — ABNORMAL HIGH
Non-HDL Cholesterol (Calc): 136 mg/dL (calc) — ABNORMAL HIGH (ref <130)
Total CHOL/HDL Ratio: 3.2 (calc) (ref <5.0)
Triglycerides: 165 mg/dL — ABNORMAL HIGH (ref <150)

## 2021-08-29 LAB — TSH: TSH: 1.59 mIU/L

## 2021-09-01 NOTE — Progress Notes (Signed)
Megan Chavez, LDL cholesterol looks much better compared to 2 years ago, great work!  Thyroid looks great.  Blood count and metabolic panel are normal as well.  Also just encourage you to consider doing your colon cancer screening.  They have updated the guidelines to start screening at age 47.  We could consider a full scope and or even a Cologuard, which is a stool test done in the privacy of your home that is good for 3 years.

## 2021-09-25 ENCOUNTER — Ambulatory Visit (INDEPENDENT_AMBULATORY_CARE_PROVIDER_SITE_OTHER): Payer: 59 | Admitting: Family Medicine

## 2021-09-25 ENCOUNTER — Encounter: Payer: Self-pay | Admitting: Family Medicine

## 2021-09-25 VITALS — BP 156/69 | HR 66 | Resp 16 | Ht 66.0 in | Wt 191.0 lb

## 2021-09-25 DIAGNOSIS — Z1211 Encounter for screening for malignant neoplasm of colon: Secondary | ICD-10-CM | POA: Diagnosis not present

## 2021-09-25 DIAGNOSIS — I10 Essential (primary) hypertension: Secondary | ICD-10-CM

## 2021-09-25 MED ORDER — LOSARTAN POTASSIUM-HCTZ 50-12.5 MG PO TABS: 1.0000 | ORAL_TABLET | Freq: Every day | ORAL | 0 refills | Status: DC

## 2021-09-25 NOTE — Assessment & Plan Note (Signed)
New diagnosis.  She recently had up-to-date blood work her thyroid is well controlled.  Renal function is stable.  We will go ahead and start losartan HCTZ.  Recommend starting with a half a tab for the first week and if tolerated well okay to go up to a whole tab.  She will monitor blood pressures at home and see me a note on my chart she is actually demanding to be traveling out of the country for several weeks so we discussed returning in 6 weeks for blood pressure when she gets back from her trip.

## 2021-09-25 NOTE — Progress Notes (Signed)
Complete physical exam  Patient: Megan Chavez   DOB: 1975-12-20   46 y.o. Female  MRN: [id number]  Subjective:    Chief Complaint  Patient presents with   Annual Exam    Not Fasting    Hypertension    6 months.    Pap Smear/Mammogram    Done 01/2021 at [facility]. Release form faxed.     Megan Chavez is a 46 y.o. female who presents today for a complete physical exam. She reports consuming a general diet. Home exercise routine includes running 3 days per week. She generally feels well.  She does have additional problems to discuss today. BP has been high for 6 months. Running in 150s. Dad with high BP.  Lst pap in Oct w/ gyn.    Most recent fall risk assessment:    09/25/2021    1:49 PM  [place] in the past year? 0  Number falls in past yr: 0  Injury with Fall? 0  Risk for fall due to : No Fall Risks  Follow up Falls prevention discussed;Falls evaluation completed     Most recent depression screenings:    09/25/2021    1:49 PM 11/17/2020   12:41 PM  PHQ 2/9 Scores  PHQ - 2 Score 0 0      Past Surgical History:  Procedure Laterality Date   CESAREAN SECTION  10/2000   CESAREAN SECTION  02/2003   CESAREAN SECTION  06/2006   Social History   Tobacco Use   Smoking status: Former    Types: Cigarettes    Quit date: 03/10/2000    Years since quitting: 21.5   Smokeless tobacco: Never  Substance Use Topics   Alcohol use: Yes    Comment: Alcohol occ   Drug use: No   Family History  Problem Relation Age of Onset   Stroke Father    Hypertension Father    Hashimoto's thyroiditis Sister    Hypothyroidism Sister    Leukemia Daughter       Patient Care Team: Hali Marry, MD as PCP - General (Family Medicine) Koleen Nimrod, Maxine Glenn, MD as Referring Physician (Endocrinology)   Outpatient Medications Prior to Visit  Medication Sig   fexofenadine (ALLEGRA) 180 MG tablet Take 180 mg by mouth daily.   levothyroxine (SYNTHROID) 75  MCG tablet Take 1 tablet by mouth daily.   [DISCONTINUED] amoxicillin-clavulanate (AUGMENTIN) 875-125 MG tablet Take 1 tablet by mouth 2 (two) times daily.   [DISCONTINUED] predniSONE (DELTASONE) 50 MG tablet Take 1 tablet (50 mg total) by mouth daily.   No facility-administered medications prior to visit.    ROS        Objective:     BP (!) 156/69   Pulse 66   Resp 16   Ht 5' 6"  (1.676 m)   Wt 191 lb (86.6 kg)   LMP 09/14/2021   SpO2 100%   BMI 30.83 kg/m     Physical Exam Vitals and nursing note reviewed.  Constitutional:      Appearance: She is well-developed.  HENT:     Head: Normocephalic and atraumatic.     Right Ear: External ear normal.     Left Ear: External ear normal.     Nose: Nose normal.  Eyes:     Conjunctiva/sclera: Conjunctivae normal.     Pupils: Pupils are equal, round, and reactive to light.  Neck:     Thyroid: No thyromegaly.  Cardiovascular:     Rate  and Rhythm: Normal rate and regular rhythm.     Heart sounds: Normal heart sounds.  Pulmonary:     Effort: Pulmonary effort is normal.     Breath sounds: Normal breath sounds. No wheezing.  Musculoskeletal:     Cervical back: Neck supple.  Lymphadenopathy:     Cervical: No cervical adenopathy.  Skin:    General: Skin is warm and dry.     Coloration: Skin is not pale.  Neurological:     Mental Status: She is alert and oriented to person, place, and time.  Psychiatric:        Behavior: Behavior normal.     No results found for any visits on 09/25/21. Last CBC Lab Results  Component Value Date   WBC 4.5 08/28/2021   HGB 12.1 08/28/2021   HCT 37.6 08/28/2021   MCV 83.9 08/28/2021   MCH 27.0 08/28/2021   RDW 14.1 08/28/2021   PLT 291 81/15/7262   Last metabolic panel Lab Results  Component Value Date   GLUCOSE 96 08/28/2021   NA 140 08/28/2021   K 3.8 08/28/2021   CL 105 08/28/2021   CO2 28 08/28/2021   BUN 11 08/28/2021   CREATININE 0.75 08/28/2021   EGFR 99 08/28/2021    CALCIUM 9.0 08/28/2021   PROT 6.2 08/28/2021   BILITOT 0.4 08/28/2021   AST 15 08/28/2021   ALT 17 08/28/2021   Last lipids Lab Results  Component Value Date   CHOL 197 08/28/2021   HDL 61 08/28/2021   LDLCALC 108 (H) 08/28/2021   TRIG 165 (H) 08/28/2021   CHOLHDL 3.2 08/28/2021   Last hemoglobin A1c No results found for: HGBA1C Last thyroid functions Lab Results  Component Value Date   TSH 1.59 08/28/2021        Assessment & Plan:    Routine Health Maintenance and Physical Exam  Immunization History  Administered Date(s) Administered   Influenza Inj Mdck Quad Pf 01/31/2014, 01/31/2017, 03/03/2017   Influenza,inj,Quad PF,6+ Mos 04/05/2018, 04/03/2019, 05/07/2020   Influenza-Unspecified 01/31/2014, 01/31/2017, 03/03/2017   Janssen (J&J) SARS-COV-2 Vaccination 08/12/2019   PFIZER(Purple Top)SARS-COV-2 Vaccination 04/29/2020   Tdap 07/18/2017    Health Maintenance  Topic Date Due   HIV Screening  Never done   Hepatitis C Screening  Never done   COLONOSCOPY (Pts 45-51yr Insurance coverage will need to be confirmed)  Never done   COVID-19 Vaccine (3 - Booster for [employer]) 10/11/2021 (Originally 06/24/2020)   INFLUENZA VACCINE  12/01/2021   MAMMOGRAM  12/03/2021   PAP SMEAR-Modifier  04/02/2024   TETANUS/TDAP  07/19/2027   Pneumococcal Vaccine 196687Years old  Aged Out   HPV VACCINES  Aged Out    Discussed health benefits of physical activity, and encouraged her to engage in regular exercise appropriate for her age and condition.  Problem List Items Addressed This Visit       Cardiovascular and Mediastinum   Essential (primary) hypertension    New diagnosis.  She recently had up-to-date blood work her thyroid is well controlled.  Renal function is stable.  We will go ahead and start losartan HCTZ.  Recommend starting with a half a tab for the first week and if tolerated well okay to go up to a whole tab.  She will monitor blood pressures at home and see me  a note on my chart she is actually demanding to be traveling out of the country for several weeks so we discussed returning in 6 weeks for blood pressure when  she gets back from her trip.       Relevant Medications   losartan-hydrochlorothiazide (HYZAAR) 50-12.5 MG tablet   Other Visit Diagnoses     Colon cancer screening    -  Primary   Relevant Orders   Cologuard      Keep up a regular exercise program and make sure you are eating a healthy diet Try to eat 4 servings of dairy a day, or if you are lactose intolerant take a calcium with vitamin D daily.  Your vaccines are up to date.    Return in about 6 weeks (around 11/06/2021) for Hypertension.     Beatrice Lecher, MD

## 2021-10-17 ENCOUNTER — Other Ambulatory Visit: Payer: Self-pay | Admitting: Family Medicine

## 2021-10-17 DIAGNOSIS — I10 Essential (primary) hypertension: Secondary | ICD-10-CM

## 2021-11-06 ENCOUNTER — Encounter: Payer: Self-pay | Admitting: Family Medicine

## 2021-11-06 ENCOUNTER — Ambulatory Visit: Payer: 59 | Admitting: Family Medicine

## 2021-11-06 VITALS — BP 116/49 | HR 66 | Resp 16 | Ht 66.0 in | Wt 192.0 lb

## 2021-11-06 DIAGNOSIS — I10 Essential (primary) hypertension: Secondary | ICD-10-CM | POA: Diagnosis not present

## 2021-11-06 MED ORDER — LOSARTAN POTASSIUM-HCTZ 50-12.5 MG PO TABS: 1.0000 | ORAL_TABLET | Freq: Every day | ORAL | 1 refills | Status: DC

## 2021-11-06 NOTE — Progress Notes (Signed)
   Established Patient Office Visit  Subjective   Patient ID: Megan Chavez, female    DOB: 06-16-1975  Age: 46 y.o. MRN: [id number]  Chief Complaint  Patient presents with   Hypertension    Follow up     HPI Hypertension- Pt denies chest pain, SOB, dizziness, or heart palpitations.  Taking meds as directed w/o problems.  Denies medication side effects.  Home BPs running ion the 120s.       ROS    Objective:     BP (!) 131/57   Pulse 71   Resp 16   Ht 5\' 6"  (1.676 m)   Wt 192 lb (87.1 kg)   LMP 10/29/2021   SpO2 97%   BMI 30.99 kg/m    Physical Exam Constitutional:      Appearance: She is well-developed.  HENT:     Head: Normocephalic and atraumatic.  Cardiovascular:     Rate and Rhythm: Normal rate and regular rhythm.     Heart sounds: Normal heart sounds.  Pulmonary:     Effort: Pulmonary effort is normal.     Breath sounds: Normal breath sounds.  Skin:    General: Skin is warm and dry.  Neurological:     Mental Status: She is alert and oriented to person, place, and time.  Psychiatric:        Behavior: Behavior normal.      No results found for any visits on 11/06/21.    The 10-year ASCVD risk score (Arnett DK, et al., 2019) is: 1.1%    Assessment & Plan:   Problem List Items Addressed This Visit       Cardiovascular and Mediastinum   Essential (primary) hypertension - Primary    Well controlled. Continue current regimen. Follow up in  6 mo       Relevant Medications   losartan-hydrochlorothiazide (HYZAAR) 50-12.5 MG tablet   Other Relevant Orders   BASIC METABOLIC PANEL WITH GFR     Return in about 6 months (around 05/09/2022) for Hypertension.    07/08/2022, MD

## 2021-11-06 NOTE — Assessment & Plan Note (Signed)
Well controlled. Continue current regimen. Follow up in  6 mo  

## 2021-11-07 LAB — BASIC METABOLIC PANEL WITH GFR
BUN: 14 mg/dL (ref 7–25)
CO2: 27 mmol/L (ref 20–32)
Calcium: 9.3 mg/dL (ref 8.6–10.2)
Chloride: 103 mmol/L (ref 98–110)
Creat: 0.7 mg/dL (ref 0.50–0.99)
Glucose, Bld: 90 mg/dL (ref 65–99)
Potassium: 3.9 mmol/L (ref 3.5–5.3)
Sodium: 138 mmol/L (ref 135–146)
eGFR: 107 mL/min/{1.73_m2} (ref >=60)

## 2021-11-09 NOTE — Progress Notes (Signed)
Your lab work is within acceptable range and there are no concerning findings.   ?

## 2021-11-16 LAB — COLOGUARD: COLOGUARD: NEGATIVE

## 2021-11-17 NOTE — Progress Notes (Signed)
Great news! Your Cologuard test is negative.  Recommend repeat colon cancer screening in 3 years.

## 2022-01-25 ENCOUNTER — Encounter: Payer: Self-pay | Admitting: Physician Assistant

## 2022-01-25 ENCOUNTER — Ambulatory Visit: Payer: 59 | Admitting: Physician Assistant

## 2022-01-25 VITALS — BP 136/82 | HR 78 | Temp 98.2°F | Ht 66.0 in | Wt 191.0 lb

## 2022-01-25 DIAGNOSIS — R051 Acute cough: Secondary | ICD-10-CM | POA: Diagnosis not present

## 2022-01-25 DIAGNOSIS — J0100 Acute maxillary sinusitis, unspecified: Secondary | ICD-10-CM

## 2022-01-25 MED ORDER — METHYLPREDNISOLONE 4 MG PO TBPK: ORAL_TABLET | ORAL | 0 refills | Status: DC

## 2022-01-25 MED ORDER — AMOXICILLIN-POT CLAVULANATE 875-125 MG PO TABS: 1.0000 | ORAL_TABLET | Freq: Two times a day (BID) | ORAL | 0 refills | Status: DC

## 2022-01-25 MED ORDER — HYDROCOD POLI-CHLORPHE POLI ER 10-8 MG/5ML PO SUER: 5.0000 mL | Freq: Two times a day (BID) | ORAL | 0 refills | Status: DC | PRN

## 2022-01-25 NOTE — Progress Notes (Addendum)
Acute Office Visit  Subjective:     Patient ID: Megan Chavez, female    DOB: 09/01/1975, 46 y.o.   MRN: [id number]  Chief Complaint  Patient presents with   Cough   Nasal Congestion    HPI Patient is in today for over 10 days of sinus pressure, headache, congestion, ear pressure, rhinorrhea, cough. She has tried OTC flonase, mucinex DM, cough syrups and just not helping. No fever, chills, body aches, SOB. She was negative for covid with home test.   .. Active Ambulatory Problems    Diagnosis Date Noted   Hypothyroidism due to Hashimoto's thyroiditis 03/04/2017   Overweight (BMI 25.0-29.9) 03/04/2017   Dyshidrotic eczema 07/18/2017   Pityriasis rosea 07/18/2017   Abdominal pain, right upper quadrant 08/18/2018   Abnormal finding on mammography 12/22/2017   Overactive bladder 04/03/2019   Elevated LDL cholesterol level 06/19/2019   Essential (primary) hypertension 09/25/2021   Resolved Ambulatory Problems    Diagnosis Date Noted   Acute maxillary sinusitis 12/05/2019   No Additional Past Medical History     ROS See HPI.      Objective:    BP 136/82   Pulse 78   Temp 98.2 F (36.8 C) (Oral)   Ht 5\' 6"  (1.676 m)   Wt 191 lb (86.6 kg)   SpO2 99%   BMI 30.83 kg/m  BP Readings from Last 3 Encounters:  01/25/22 136/82  11/06/21 (!) 116/49  09/25/21 (!) 156/69   Wt Readings from Last 3 Encounters:  01/25/22 191 lb (86.6 kg)  11/06/21 192 lb (87.1 kg)  09/25/21 191 lb (86.6 kg)      Physical Exam Constitutional:      Appearance: Normal appearance. She is obese.  HENT:     Head: Normocephalic.     Right Ear: Ear canal and external ear normal. There is no impacted cerumen.     Left Ear: Ear canal and external ear normal. There is no impacted cerumen.     Ears:     Comments: Bilateral TM's erythematous with dull light reflex.     Nose: Congestion and rhinorrhea present.     Mouth/Throat:     Mouth: Mucous membranes are moist.     Pharynx: Posterior  oropharyngeal erythema present. No oropharyngeal exudate.  Eyes:     Extraocular Movements: Extraocular movements intact.     Conjunctiva/sclera: Conjunctivae normal.     Pupils: Pupils are equal, round, and reactive to light.  Cardiovascular:     Rate and Rhythm: Normal rate and regular rhythm.  Pulmonary:     Effort: Pulmonary effort is normal.     Breath sounds: Normal breath sounds. No wheezing or rhonchi.  Musculoskeletal:     Cervical back: Normal range of motion. No tenderness.  Lymphadenopathy:     Cervical: No cervical adenopathy.  Neurological:     General: No focal deficit present.     Mental Status: She is alert.  Psychiatric:        Mood and Affect: Mood normal.          Assessment & Plan:  Marland KitchenMarland KitchenDiavian was seen today for cough and nasal congestion.  Diagnoses and all orders for this visit:  Acute non-recurrent maxillary sinusitis -     amoxicillin-clavulanate (AUGMENTIN) 875-125 MG tablet; Take 1 tablet by mouth 2 (two) times daily. -     methylPREDNISolone (MEDROL DOSEPAK) 4 MG TBPK tablet; Take as directed by package insert.  Acute cough -     chlorpheniramine-HYDROcodone (  TUSSIONEX) 10-8 MG/5ML; Take 5 mLs by mouth every 12 (twelve) hours as needed for cough (cough, will cause drowsiness.).   Over 10 days of symptoms Negative for covid with home test Treated for sinusitis with augmentin/medrol dose pack and tussionex Rest and hydrate Follow up as needed and if symptoms persist or worsen   Tandy Gaw, PA-C

## 2022-01-25 NOTE — Patient Instructions (Signed)

## 2022-01-28 LAB — HM MAMMOGRAPHY

## 2022-04-15 ENCOUNTER — Encounter: Payer: Self-pay | Admitting: Family Medicine

## 2022-04-15 ENCOUNTER — Ambulatory Visit (INDEPENDENT_AMBULATORY_CARE_PROVIDER_SITE_OTHER): Payer: 59 | Admitting: Family Medicine

## 2022-04-15 VITALS — BP 119/66 | HR 75 | Ht 66.0 in | Wt 190.0 lb

## 2022-04-15 DIAGNOSIS — J019 Acute sinusitis, unspecified: Secondary | ICD-10-CM

## 2022-04-15 DIAGNOSIS — Z1231 Encounter for screening mammogram for malignant neoplasm of breast: Secondary | ICD-10-CM | POA: Diagnosis not present

## 2022-04-15 MED ORDER — DOXYCYCLINE HYCLATE 100 MG PO TABS
100.0000 mg | ORAL_TABLET | Freq: Two times a day (BID) | ORAL | 0 refills | Status: DC
Start: 2022-04-15 — End: 2022-10-07

## 2022-04-15 NOTE — Progress Notes (Signed)
   Acute Office Visit  Subjective:     Patient ID: Megan Chavez, female    DOB: Feb 02, 1976, 46 y.o.   MRN: [id number]  Chief Complaint  Patient presents with   Sinus Problem    HPI Patient is in today for sinus sxs including nasal congestion, headache, facial pain and pressure for about 10 days.  She did go to urgent care earlier and was treated with a 5-day course of prednisone she said she went because the congestion was just so severe.  It helped a little but she still does not feel like she is getting tremendously better now the drainage is turning more yellow and she is having pain radiating into her teeth.  She is coughing first thing in the morning, in the evenings and overnight it is not as bad during the day.  No shortness of breath.  She has had a raspy voice.  ROS      Objective:    BP 119/66 (BP Location: Left Arm, Patient Position: Sitting, Cuff Size: Large)   Pulse 75   Ht 5\' 6"  (1.676 m)   Wt 190 lb 0.6 oz (86.2 kg)   SpO2 100%   BMI 30.67 kg/m    Physical Exam Vitals and nursing note reviewed.  Constitutional:      Appearance: She is well-developed.  HENT:     Head: Normocephalic and atraumatic.     Right Ear: Tympanic membrane, ear canal and external ear normal.     Left Ear: Tympanic membrane, ear canal and external ear normal.     Nose: Nose normal.     Mouth/Throat:     Pharynx: Oropharynx is clear.  Eyes:     Conjunctiva/sclera: Conjunctivae normal.     Pupils: Pupils are equal, round, and reactive to light.  Neck:     Thyroid: No thyromegaly.  Cardiovascular:     Rate and Rhythm: Normal rate and regular rhythm.     Heart sounds: Normal heart sounds.  Pulmonary:     Effort: Pulmonary effort is normal.     Breath sounds: Normal breath sounds. No wheezing.  Musculoskeletal:     Cervical back: Neck supple.  Lymphadenopathy:     Cervical: No cervical adenopathy.  Skin:    General: Skin is warm and dry.  Neurological:     Mental Status: She is  alert and oriented to person, place, and time.  Psychiatric:        Behavior: Behavior normal.     No results found for any visits on 04/15/22.      Assessment & Plan:   Problem List Items Addressed This Visit   None Visit Diagnoses     Screening mammogram for breast cancer    -  Primary   Acute non-recurrent sinusitis, unspecified location       Relevant Medications   doxycycline (VIBRA-TABS) 100 MG tablet      Acute sinusitis-we will treat with doxycycline okay to continue symptomatic care.  Call if not better in 1 week.  Meds ordered this encounter  Medications   doxycycline (VIBRA-TABS) 100 MG tablet    Sig: Take 1 tablet (100 mg total) by mouth 2 (two) times daily.    Dispense:  14 tablet    Refill:  0    No follow-ups on file.  04/17/22, MD

## 2022-05-10 ENCOUNTER — Ambulatory Visit (INDEPENDENT_AMBULATORY_CARE_PROVIDER_SITE_OTHER): Payer: 59

## 2022-05-10 ENCOUNTER — Encounter: Payer: Self-pay | Admitting: Family Medicine

## 2022-05-10 ENCOUNTER — Ambulatory Visit: Payer: 59 | Admitting: Family Medicine

## 2022-05-10 VITALS — BP 132/63 | HR 74 | Ht 66.0 in | Wt 191.0 lb

## 2022-05-10 DIAGNOSIS — R0781 Pleurodynia: Secondary | ICD-10-CM

## 2022-05-10 NOTE — Progress Notes (Signed)
   Acute Office Visit  Subjective:     Patient ID: Madyn Ivins, female    DOB: 1976/02/10, 47 y.o.   MRN: [id number]  Chief Complaint  Patient presents with   Sinusitis    HPI Patient is in today for persistent sinus sxs.  Seen about 3 weeks ago and given doxy.  She has gradually been getting better it is just been very slow.  The cough and congestion have both improved but not completely resolved.  But she came in today because she was concerned because right before Christmas she had had a pretty severe coughing fit the night before and when she woke up in the morning she had right anterior rib pain radiating to her side just slightly.  She has noticed that it is a little harder to take a deep breath.  She tried to walk and exercise with her husband and said she can only do about half of what she was able to do previously.  She is also had a little bit more reflux and GERD lately as well as some rash but no nausea or vomiting.  ROS      Objective:    BP 132/63   Pulse 74   Ht 5\' 6"  (1.676 m)   Wt 191 lb (86.6 kg)   SpO2 100%   BMI 30.83 kg/m    Physical Exam Vitals and nursing note reviewed.  Constitutional:      Appearance: She is well-developed.  HENT:     Head: Normocephalic and atraumatic.  Cardiovascular:     Rate and Rhythm: Normal rate and regular rhythm.     Heart sounds: Normal heart sounds.  Pulmonary:     Effort: Pulmonary effort is normal.     Breath sounds: Normal breath sounds.  Chest:    Abdominal:     General: Bowel sounds are normal.     Palpations: Abdomen is soft.     Tenderness: There is abdominal tenderness.     Comments: Tender over the right anterior rib cage area.  Nontender over the abdominal area.  Skin:    General: Skin is warm and dry.  Neurological:     Mental Status: She is alert and oriented to person, place, and time.  Psychiatric:        Behavior: Behavior normal.     No results found for any visits on 05/10/22.       Assessment & Plan:   Problem List Items Addressed This Visit   None Visit Diagnoses     Rib pain    -  Primary   Relevant Orders   DG Ribs Unilateral W/Chest Right       Rib pain-we will go ahead and get a rib film as well as chest x-ray especially with the shortness of breath just to rule out any persistent infection since she still feels like she is not quite 100% better even though she is already been sick for most a month.  Will call with results once available.  Okay with conservative care for now.  Consider working up further for other issues such as gallbladder disease if not improving.  No orders of the defined types were placed in this encounter.   No follow-ups on file.  Beatrice Lecher, MD

## 2022-05-11 NOTE — Progress Notes (Signed)
Hi Megan Chavez, good news!  No sign of rib fracture.  I still think it could be musculoskeletal.  Lets try to give it a week or 2 and see if it improves.  If you are able to take ibuprofen or Aleve you can try that for the next for 5 days and see if that helps as well.

## 2022-05-12 ENCOUNTER — Ambulatory Visit: Payer: 59 | Admitting: Family Medicine

## 2022-07-12 ENCOUNTER — Other Ambulatory Visit: Payer: Self-pay | Admitting: Family Medicine

## 2022-07-12 DIAGNOSIS — I10 Essential (primary) hypertension: Secondary | ICD-10-CM

## 2022-10-07 ENCOUNTER — Ambulatory Visit: Payer: 59 | Admitting: Family Medicine

## 2022-10-07 ENCOUNTER — Encounter: Payer: Self-pay | Admitting: Family Medicine

## 2022-10-07 ENCOUNTER — Ambulatory Visit (INDEPENDENT_AMBULATORY_CARE_PROVIDER_SITE_OTHER): Payer: 59

## 2022-10-07 VITALS — BP 120/49 | HR 68 | Ht 66.0 in | Wt 186.0 lb

## 2022-10-07 DIAGNOSIS — K2100 Gastro-esophageal reflux disease with esophagitis, without bleeding: Secondary | ICD-10-CM

## 2022-10-07 DIAGNOSIS — R1013 Epigastric pain: Secondary | ICD-10-CM | POA: Diagnosis not present

## 2022-10-07 NOTE — Progress Notes (Signed)
Acute Office Visit  Subjective:     Patient ID: Megan Chavez, female    DOB: 12/12/1975, 47 y.o.   MRN: [id number]  Chief Complaint  Patient presents with   Gastroesophageal Reflux    HPI  Patient is in today for epigastric pain that is radiating into her back.  Tends to be triggered after eating.  She has been under a lot of stress over the last couple of weeks after her husband collapsed and was resuscitated he had had a heart attack.  She says she is actually cleaned up and changed her diet in the last couple of weeks.  Has been belching more as well.  No significant nausea or vomiting.  She reports that her sxs began approximately 2 weeks ago. She has tried Prilosec, Gaviscon with some relief. She stated that she has pain in her chest and back from this. She has stopped caffeine and acidic foods    ROS      Objective:    BP (!) 120/49   Pulse 68   Ht 5\' 6"  (1.676 m)   Wt 186 lb (84.4 kg)   SpO2 99%   BMI 30.02 kg/m    Physical Exam Vitals and nursing note reviewed.  Constitutional:      Appearance: She is well-developed.  HENT:     Head: Normocephalic and atraumatic.     Right Ear: External ear normal.     Left Ear: External ear normal.     Nose: Nose normal.  Eyes:     Conjunctiva/sclera: Conjunctivae normal.     Pupils: Pupils are equal, round, and reactive to light.  Neck:     Thyroid: No thyromegaly.  Cardiovascular:     Rate and Rhythm: Normal rate and regular rhythm.     Heart sounds: Normal heart sounds.  Pulmonary:     Effort: Pulmonary effort is normal.     Breath sounds: Normal breath sounds. No wheezing.  Abdominal:     General: Bowel sounds are normal.     Palpations: Abdomen is soft.     Tenderness: There is abdominal tenderness.     Comments: Mild right upper quadrant tenderness.  Musculoskeletal:     Cervical back: Neck supple.  Lymphadenopathy:     Cervical: No cervical adenopathy.  Skin:    General: Skin is warm and dry.   Neurological:     Mental Status: She is alert and oriented to person, place, and time.  Psychiatric:        Behavior: Behavior normal.     No results found for any visits on 10/07/22.      Assessment & Plan:   Problem List Items Addressed This Visit   None Visit Diagnoses     Epigastric pain    -  Primary   Relevant Orders   US Abdomen Complete   CBC with Differential/Platelet   COMPLETE METABOLIC PANEL WITH GFR   Lipase   Gastroesophageal reflux disease with esophagitis without hemorrhage       Relevant Orders   US Abdomen Complete   CBC with Differential/Platelet   COMPLETE METABOLIC PANEL WITH GFR   Lipase       Epigastric pain concerning for possible cholecystitis.  Will get ultrasound either today or tomorrow if at all possible.  Will check labs to rule out infection or pancreatitis.  She is not having any vomiting or fever which is reassuring.  Consider GERD gastritis or Oestreich ulcers.  Recommend she continue with the  PPI but increase to twice a day.  Avoid greasy oily fatty or spicy foods.  She is also been under a lot of stress recently with her husband who had a sudden heart attack and collapsed.  I think she would benefit from therapy/counseling and encouraged her to think about it we have 2 great therapist here.  Encouraged her to reach out if she feels like at any point that would be helpful.  No orders of the defined types were placed in this encounter.   No follow-ups on file.  Nani Gasser, MD

## 2022-10-07 NOTE — Progress Notes (Signed)
She reports that her sxs began approximately 2 weeks ago. She has tried Prilosec, Gaviscon with some relief. She stated that she has pain in her chest and back from this. She has stopped caffeine and acidic foods.

## 2022-10-07 NOTE — Patient Instructions (Signed)
Continue Prilosec but take it twice a day at least for the next couple days while awaiting for lab results and getting the ultrasound scheduled.  Continue with a bland diet.

## 2022-10-08 LAB — CBC WITH DIFFERENTIAL/PLATELET
Absolute Monocytes: 479 cells/uL (ref 200–950)
Basophils Absolute: 29 cells/uL (ref 0–200)
Basophils Relative: 0.5 %
Eosinophils Absolute: 57 cells/uL (ref 15–500)
Eosinophils Relative: 1 %
HCT: 38.9 % (ref 35.0–45.0)
Hemoglobin: 12.5 g/dL (ref 11.7–15.5)
Lymphs Abs: 1134 cells/uL (ref 850–3900)
MCH: 27.4 pg (ref 27.0–33.0)
MCHC: 32.1 g/dL (ref 32.0–36.0)
MCV: 85.3 fL (ref 80.0–100.0)
MPV: 10.7 fL (ref 7.5–12.5)
Monocytes Relative: 8.4 %
Neutro Abs: 4001 cells/uL (ref 1500–7800)
Neutrophils Relative %: 70.2 %
Platelets: 275 10*3/uL (ref 140–400)
RBC: 4.56 10*6/uL (ref 3.80–5.10)
RDW: 13.5 % (ref 11.0–15.0)
Total Lymphocyte: 19.9 %
WBC: 5.7 10*3/uL (ref 3.8–10.8)

## 2022-10-08 LAB — COMPLETE METABOLIC PANEL WITH GFR
AG Ratio: 1.9 (calc) (ref 1.0–2.5)
ALT: 11 U/L (ref 6–29)
AST: 13 U/L (ref 10–35)
Albumin: 4.3 g/dL (ref 3.6–5.1)
Alkaline phosphatase (APISO): 68 U/L (ref 31–125)
BUN: 17 mg/dL (ref 7–25)
CO2: 23 mmol/L (ref 20–32)
Calcium: 9.7 mg/dL (ref 8.6–10.2)
Chloride: 106 mmol/L (ref 98–110)
Creat: 0.8 mg/dL (ref 0.50–0.99)
Globulin: 2.3 g/dL (calc) (ref 1.9–3.7)
Glucose, Bld: 86 mg/dL (ref 65–99)
Potassium: 4.6 mmol/L (ref 3.5–5.3)
Sodium: 138 mmol/L (ref 135–146)
Total Bilirubin: 0.4 mg/dL (ref 0.2–1.2)
Total Protein: 6.6 g/dL (ref 6.1–8.1)
eGFR: 91 mL/min/{1.73_m2} (ref >=60)

## 2022-10-08 LAB — LIPASE: Lipase: 39 U/L (ref 7–60)

## 2022-10-08 NOTE — Progress Notes (Signed)
Hi Brandee, the gallbladder itself looks okay no stones or thickening.  No liver lesions.  Spleen looks okay.  Kidneys look okay overall you do have a 10 mm Brendlinger in the right kidney but it is not blocking or causing a problem is just sitting there.  Currently continue taking the Prilosec twice a day through the weekend and lets see if that helps settle the stomach.  Continue to avoid anything greasy spicy or really acidic including tomatoes marinara etc. stay away from juices which are very acidic.  Just basically recommend eating a very bland diet.  I know you are already avoiding caffeine.  If you are not getting some relief after the weekend then I would like to refer you to GI for further workup.  Please just give [place] a call Monday and let us know and were happy to place that referral for you.

## 2022-10-08 NOTE — Progress Notes (Signed)
Megan Chavez, your blood count looks good.  No sign of systemic infection.  Liver functions also normal.  No sign of pancreatitis.  Kidney function is also reassuring.  You are not feeling better after the weekend and the plan will be to refer you to GI for further workup.

## 2022-10-12 ENCOUNTER — Other Ambulatory Visit: Payer: Self-pay | Admitting: Family Medicine

## 2022-10-12 DIAGNOSIS — I10 Essential (primary) hypertension: Secondary | ICD-10-CM

## 2022-10-25 ENCOUNTER — Telehealth: Payer: Self-pay | Admitting: General Practice

## 2022-10-25 NOTE — Transitions of Care (Post Inpatient/ED Visit) (Signed)
   10/25/2022  Name: Megan Chavez MRN: [id number] DOB: June 17, 1975  Today's TOC FU Call Status: Today's TOC FU Call Status:: Successful TOC FU Call Competed TOC FU Call Complete Date: 10/25/22  Transition Care Management Follow-up Telephone Call Date of Discharge: 10/22/22 Discharge Facility: Other ([facility]) Name of Other ([facility]) Discharge Facility: [facility] Type of Discharge: Emergency Department Reason for ED Visit: Cardiac Conditions Cardiac Conditions Diagnosis:  (Heart palpitations) How have you been since you were released from the hospital?: Better Any questions or concerns?: No  Items Reviewed: Did you receive and understand the discharge instructions provided?: Yes Medications obtained,verified, and reconciled?: Yes (Medications Reviewed) Any new allergies since your discharge?: No Dietary orders reviewed?: NA Do you have support at home?: Yes  Medications Reviewed Today: Medications Reviewed Today     Reviewed by Modesto Charon, RN (Registered Nurse) on 10/25/22 at 1042  Med List Status: <None>   Medication Order Taking? Sig Documenting Provider Last Dose Status Informant  levothyroxine (SYNTHROID) 75 MCG tablet [id number] No Take 1 tablet by mouth daily. [provider] Taking Active   losartan-hydrochlorothiazide Mauri Reading) 50-12.5 MG tablet [id number]  TAKE 1 TABLET BY MOUTH EVERY DAY Agapito Games, MD  Active             Home Care and Equipment/Supplies: Were Home Health Services Ordered?: NA Any new equipment or medical supplies ordered?: NA  Functional Questionnaire: Do you need assistance with bathing/showering or dressing?: No Do you need assistance with meal preparation?: No Do you need assistance with eating?: No Do you have difficulty maintaining continence: No Do you need assistance with getting out of bed/getting out of a chair/moving?: No Do you have difficulty managing or taking your medications?:  No  Follow up appointments reviewed: PCP Follow-up appointment confirmed?: Yes Date of PCP follow-up appointment?: 10/29/22 Follow-up Provider: Dr. Linford Arnold Specialist [facility] Follow-up appointment confirmed?: Yes Date of Specialist follow-up appointment?: 10/27/22 Follow-Up Specialty Provider:: Laurette Schimke Do you need transportation to your follow-up appointment?: No Do you understand care options if your condition(s) worsen?: Yes-patient verbalized understanding    SIGNATURE Modesto Charon, RN BSN Nurse Health Advisor

## 2022-10-29 ENCOUNTER — Ambulatory Visit: Payer: 59 | Admitting: Family Medicine

## 2022-11-02 ENCOUNTER — Ambulatory Visit: Payer: 59 | Admitting: Family Medicine

## 2022-11-02 ENCOUNTER — Encounter: Payer: Self-pay | Admitting: Family Medicine

## 2022-11-02 VITALS — BP 113/58 | HR 56 | Ht 66.0 in | Wt 186.0 lb

## 2022-11-02 DIAGNOSIS — K219 Gastro-esophageal reflux disease without esophagitis: Secondary | ICD-10-CM | POA: Insufficient documentation

## 2022-11-02 DIAGNOSIS — E038 Other specified hypothyroidism: Secondary | ICD-10-CM | POA: Diagnosis not present

## 2022-11-02 DIAGNOSIS — K2100 Gastro-esophageal reflux disease with esophagitis, without bleeding: Secondary | ICD-10-CM

## 2022-11-02 DIAGNOSIS — R002 Palpitations: Secondary | ICD-10-CM | POA: Diagnosis not present

## 2022-11-02 DIAGNOSIS — I10 Essential (primary) hypertension: Secondary | ICD-10-CM | POA: Diagnosis not present

## 2022-11-02 DIAGNOSIS — E063 Autoimmune thyroiditis: Secondary | ICD-10-CM

## 2022-11-02 MED ORDER — LOSARTAN POTASSIUM 50 MG PO TABS
50.0000 mg | ORAL_TABLET | Freq: Every day | ORAL | 1 refills | Status: DC
Start: 2022-11-02 — End: 2022-12-27

## 2022-11-02 NOTE — Assessment & Plan Note (Signed)
At goal. Will stop hct component of pill.  Change to losartan 50mg .   F/U in 8 weeks.

## 2022-11-02 NOTE — Assessment & Plan Note (Signed)
TSH in ED at goal.

## 2022-11-02 NOTE — Progress Notes (Signed)
Established Patient Office Visit  Subjective   Patient ID: Megan Chavez, female    DOB: [birth year]  Age: 47 y.o. MRN: [id number]  Chief Complaint  Patient presents with   Follow-up    HPI  Here today for follow-up from the emergency department she was seen at [facility] on June 21 for heart palpitations and epigastric pain.  She describes the palpitations as a pounding in her heart but not necessarily a fast rhythm the night before she noticed that her heart was racing and pounding on and off.  He also been experiencing epigastric abdominal pain for about 4 weeks prior.  They followed troponins in the ED and they were negative.  Zio patch was placed.  She already had a GI appointment scheduled for epigastric pain and was encouraged to keep that as well.  Chest x-ray was normal.  EKG showed low voltage QRS but no ischemic changes.  She did follow-up with gastroenterology at [facility] on June 26 at [facility].  They discussed possible EGD for further workup and she was scheduled.  Cologuard testing in July was negative.  They are treating her for GERD.  Hypothyroidism - Taking medication regularly in the AM away from food and vitamins, etc. No recent change to skin, hair, or energy levels. They did a TSH in the ED.    Has noticed at home since changing her diet and cutting out salt her BPs have been about 10 pts lower.  She has felt a little more tired when they are low.     ROS    Objective:     BP (!) 113/58   Pulse (!) 56   Ht 5\' 6"  (1.676 m)   Wt 186 lb (84.4 kg)   SpO2 100%   BMI 30.02 kg/m    Physical Exam Vitals and nursing note reviewed.  Constitutional:      Appearance: She is well-developed.  HENT:     Head: Normocephalic and atraumatic.  Cardiovascular:     Rate and Rhythm: Normal rate and regular rhythm.     Heart sounds: Normal heart sounds.  Pulmonary:     Effort: Pulmonary effort is normal.     Breath sounds: Normal breath sounds.  Skin:     General: Skin is warm and dry.  Neurological:     Mental Status: She is alert and oriented to person, place, and time.  Psychiatric:        Behavior: Behavior normal.      No results found for any visits on 11/02/22.    The 10-year ASCVD risk score (Arnett DK, et al., 2019) is: 0.9%    Assessment & Plan:   Problem List Items Addressed This Visit       Cardiovascular and Mediastinum   Essential (primary) hypertension - Primary    At goal. Will stop hct component of pill.  Change to losartan 50mg .   F/U in 8 weeks.       Relevant Medications   losartan (COZAAR) 50 MG tablet     Digestive   GERD (gastroesophageal reflux disease)    EGD 10/2022 at [facility].  On 40mg  omeprazole BID for now.         Relevant Medications   omeprazole (PRILOSEC) 40 MG capsule     Endocrine   Hypothyroidism due to Hashimoto's thyroiditis    TSH in ED at goal.         Other   Palpitations    She is awaiting  Zio Patch results I am happy to review once results are back in.  [facility].  She has been feeling somewhat better       Return in about 2 months (around 01/03/2023) for BP check with change of medication. .   I spent 25 minutes on the day of the encounter to include pre-visit record review, face-to-face time with the patient and post visit ordering of test.   Nani Gasser, MD

## 2022-11-02 NOTE — Assessment & Plan Note (Signed)
EGD 10/2022 at [facility].  On 40mg  omeprazole BID for now.

## 2022-11-02 NOTE — Assessment & Plan Note (Signed)
She is awaiting Zio Patch results I am happy to review once results are back in.  [facility].  She has been feeling somewhat better

## 2022-11-03 ENCOUNTER — Encounter: Payer: Self-pay | Admitting: Family Medicine

## 2022-11-03 MED ORDER — METOPROLOL SUCCINATE ER 25 MG PO TB24
25.0000 mg | ORAL_TABLET | Freq: Every day | ORAL | 2 refills | Status: AC
Start: 2022-11-03 — End: ?

## 2022-11-08 ENCOUNTER — Ambulatory Visit: Payer: 59 | Admitting: Family Medicine

## 2022-11-08 ENCOUNTER — Encounter: Payer: Self-pay | Admitting: Family Medicine

## 2022-11-08 VITALS — BP 125/62 | HR 63 | Ht 66.0 in | Wt 184.0 lb

## 2022-11-08 DIAGNOSIS — R1012 Left upper quadrant pain: Secondary | ICD-10-CM

## 2022-11-08 DIAGNOSIS — R110 Nausea: Secondary | ICD-10-CM | POA: Diagnosis not present

## 2022-11-08 DIAGNOSIS — R002 Palpitations: Secondary | ICD-10-CM | POA: Diagnosis not present

## 2022-11-08 DIAGNOSIS — R251 Tremor, unspecified: Secondary | ICD-10-CM

## 2022-11-08 DIAGNOSIS — E063 Autoimmune thyroiditis: Secondary | ICD-10-CM

## 2022-11-08 DIAGNOSIS — I10 Essential (primary) hypertension: Secondary | ICD-10-CM

## 2022-11-08 DIAGNOSIS — E038 Other specified hypothyroidism: Secondary | ICD-10-CM

## 2022-11-08 LAB — CBC WITH DIFFERENTIAL/PLATELET
Absolute Monocytes: 498 cells/uL (ref 200–950)
Basophils Absolute: 28 cells/uL (ref 0–200)
Basophils Relative: 0.5 %
Eosinophils Absolute: 67 cells/uL (ref 15–500)
Eosinophils Relative: 1.2 %
MCH: 27.9 pg (ref 27.0–33.0)
MCHC: 32.7 g/dL (ref 32.0–36.0)
Monocytes Relative: 8.9 %
Neutrophils Relative %: 67.9 %
Platelets: 290 10*3/uL (ref 140–400)

## 2022-11-08 NOTE — Progress Notes (Signed)
Established Patient Office Visit  Subjective   Patient ID: Megan Chavez, female    DOB: 18-May-1975  Age: 47 y.o. MRN: [id number]  Chief Complaint  Patient presents with   Dizziness    HPI She is here today to follow-up on her recent palpitations but in the meantime she has also developed some new symptoms that she is concerned about.  She says at night she has been waking up and just feeling really shaky and jittery.  In fact last night it happened twice but then she has had a few nights where it did not happen at all.  She wondered initially if it could be a side effect of the metoprolol also in fact she took a half of a tab last night.  The metoprolol has actually been really helping with some of the palpitations and irregular beats.  So she has been taking it in the evening and then taking her regular blood pressure pill in the morning.  She is also noticed in the last couple of days that she has had some left upper quadrant pain.  It feels tender to touch and she notices that if she presses on the area to almost make it feel like her heart is skipping.  She says that is been going on for maybe 3 days.  She is felt a little nauseated with it as well.no vomiting.      She also reports that she gets full easily.    She questions if this is a side effect of the medication     ROS    Objective:     BP 125/62   Pulse 63   Ht 5\' 6"  (1.676 m)   Wt 184 lb (83.5 kg)   SpO2 100%   BMI 29.70 kg/m     Physical Exam Vitals and nursing note reviewed.  Constitutional:      Appearance: She is well-developed.  HENT:     Head: Normocephalic and atraumatic.  Cardiovascular:     Rate and Rhythm: Normal rate and regular rhythm.     Heart sounds: Normal heart sounds.  Pulmonary:     Effort: Pulmonary effort is normal.     Breath sounds: Normal breath sounds.  Abdominal:     General: Bowel sounds are normal.     Palpations: Abdomen is soft.     Tenderness: There is abdominal  tenderness.     Comments: She does have some slight tenderness in the left upper quadrant area a little more medially.  No organomegaly.  Skin:    General: Skin is warm and dry.  Neurological:     Mental Status: She is alert and oriented to person, place, and time.  Psychiatric:        Behavior: Behavior normal.      No results found for any visits on 11/08/22.     The 10-year ASCVD risk score (Arnett DK, et al., 2019) is: 1.1%    Assessment & Plan:   Problem List Items Addressed This Visit       Cardiovascular and Mediastinum   Essential (primary) hypertension    Pressure looks absolutely perfect.  Continue current regimen.        Endocrine   Hypothyroidism due to Hashimoto's thyroiditis    Send thyroid levels were normal so I do not think this is contributing to the shakiness that she has been experiencing.        Other   Palpitations    Seem to be  really well-controlled on a low-dose of metoprolol.  And she is taking a half of a tab of her current blood pressure pill as well.      Other Visit Diagnoses     LUQ pain    -  Primary   Relevant Orders   CT Abdomen Pelvis W Contrast   Lipase   Hemoglobin A1c   CBC with Differential/Platelet   Nausea       Relevant Orders   CT Abdomen Pelvis W Contrast   Lipase   Hemoglobin A1c   CBC with Differential/Platelet   Shakiness       Relevant Orders   Lipase   Hemoglobin A1c   CBC with Differential/Platelet       Itchiness-unclear etiology could she be having some hypoglycemic episodes it certainly possible.  Will check an A1c.  This is not a typical side effect of beta-blockers if anything beta-blockers can sometimes help with the tremor.  Also consider increased anxiety levels especially with everything going on  Left upper quadrant pain-we discussed pancreatitis versus spleen issues.  Will check a lipase.  Consider getting CT abdomen pelvis for further workup as well she is tender on exam and I do not have  a great explanation she is also having some early satiety and nausea.  No follow-ups on file.   I spent 25 minutes on the day of the encounter to include pre-visit record review, face-to-face time with the patient and post visit ordering of test.   Nani Gasser, MD

## 2022-11-08 NOTE — Assessment & Plan Note (Signed)
Pressure looks absolutely perfect.  Continue current regimen.

## 2022-11-08 NOTE — Assessment & Plan Note (Signed)
Send thyroid levels were normal so I do not think this is contributing to the shakiness that she has been experiencing.

## 2022-11-08 NOTE — Progress Notes (Signed)
Pt reports that the episodes happen more at night when her whole body shakes. She takes Losartan in the morning and the Metoprolol in the evening.(Only took 1/2 dose of Metoprolol last night). Palpitations have gotten better she only feels every once in a while.   She also reports that she gets full easily.   She questions if this is a side effect of the medication.

## 2022-11-08 NOTE — Assessment & Plan Note (Signed)
Seem to be really well-controlled on a low-dose of metoprolol.  And she is taking a half of a tab of her current blood pressure pill as well.

## 2022-11-09 ENCOUNTER — Encounter: Payer: Self-pay | Admitting: Family Medicine

## 2022-11-09 LAB — HEMOGLOBIN A1C
Hgb A1c MFr Bld: 5.3 % of total Hgb (ref <5.7)
Mean Plasma Glucose: 105 mg/dL
eAG (mmol/L): 5.8 mmol/L

## 2022-11-09 LAB — CBC WITH DIFFERENTIAL/PLATELET
HCT: 37.3 % (ref 35.0–45.0)
Hemoglobin: 12.2 g/dL (ref 11.7–15.5)
Lymphs Abs: 1204 cells/uL (ref 850–3900)
MCV: 85.2 fL (ref 80.0–100.0)
MPV: 10.8 fL (ref 7.5–12.5)
Neutro Abs: 3802 cells/uL (ref 1500–7800)
RBC: 4.38 10*6/uL (ref 3.80–5.10)
RDW: 13.1 % (ref 11.0–15.0)
Total Lymphocyte: 21.5 %
WBC: 5.6 10*3/uL (ref 3.8–10.8)

## 2022-11-09 LAB — LIPASE: Lipase: 39 U/L (ref 7–60)

## 2022-11-09 NOTE — Progress Notes (Signed)
HI [place], no sign of pancreatitis.  NO diabetes.  Metabolic panel is normal.

## 2022-11-12 ENCOUNTER — Encounter: Payer: Self-pay | Admitting: Family Medicine

## 2022-11-15 ENCOUNTER — Telehealth: Payer: Self-pay

## 2022-11-15 NOTE — Transitions of Care (Post Inpatient/ED Visit) (Unsigned)
   11/15/2022  Name: Megan Chavez MRN: [id number] DOB: 09-24-1975  Today's TOC FU Call Status: Today's TOC FU Call Status:: Unsuccessul Call (1st Attempt) Unsuccessful Call (1st Attempt) Date: 11/15/22  Attempted to reach the patient regarding the most recent Inpatient/ED visit.  Follow Up Plan: Additional outreach attempts will be made to reach the patient to complete the Transitions of Care (Post Inpatient/ED visit) call.   Signature Karena Addison, LPN [facility] Nurse Health Advisor Direct Dial [phone]

## 2022-11-16 NOTE — Transitions of Care (Post Inpatient/ED Visit) (Signed)
   11/16/2022  Name: Megan Chavez MRN: [id number] DOB: 05-30-1975  Today's TOC FU Call Status: Today's TOC FU Call Status:: Successful TOC FU Call Competed Unsuccessful Call (1st Attempt) Date: 11/15/22 [facility] FU Call Complete Date: 11/16/22  Transition Care Management Follow-up Telephone Call Date of Discharge: 11/12/22 Discharge Facility: Other [facility]) Name of Other ([facility]) Discharge Facility: [facility] Type of Discharge: Inpatient Admission Primary Inpatient Discharge Diagnosis:: palpitation How have you been since you were released from the hospital?: Better Any questions or concerns?: No  Items Reviewed: Did you receive and understand the discharge instructions provided?: Yes Medications obtained,verified, and reconciled?: Yes (Medications Reviewed) Any new allergies since your discharge?: No Dietary orders reviewed?: Yes Do you have support at home?: Yes People in Home: spouse  Medications Reviewed Today: Medications Reviewed Today     Reviewed by Karena Addison, LPN (Licensed Practical Nurse) on 11/16/22 at 1439  Med List Status: <None>   Medication Order Taking? Sig Documenting Provider Last Dose Status Informant  levothyroxine (SYNTHROID) 75 MCG tablet [id number] No Take 1 tablet by mouth daily. [provider] Taking Active   losartan (COZAAR) 50 MG tablet [id number]  Take 1 tablet (50 mg total) by mouth daily. Agapito Games, MD  Active   metoprolol succinate (TOPROL-XL) 25 MG 24 hr tablet [id number]  Take 1 tablet (25 mg total) by mouth daily. Agapito Games, MD  Active   omeprazole (PRILOSEC) 40 MG capsule [id number] No Take 40 mg by mouth 2 (two) times daily. [provider] Taking Active             Home Care and Equipment/Supplies: Were Home Health Services Ordered?: NA Any new equipment or medical supplies ordered?: NA  Functional Questionnaire: Do you need assistance with bathing/showering or dressing?: No Do you  need assistance with meal preparation?: No Do you need assistance with eating?: No Do you have difficulty maintaining continence: No Do you need assistance with getting out of bed/getting out of a chair/moving?: No Do you have difficulty managing or taking your medications?: No  Follow up appointments reviewed: PCP Follow-up appointment confirmed?: Yes Date of PCP follow-up appointment?: 11/23/22 Specialist Hospital Follow-up appointment confirmed?: No Follow-Up Specialty Provider:: metheney Reason Specialist Follow-Up Not Confirmed: Patient has Specialist Provider Number and will Call for Appointment Do you need transportation to your follow-up appointment?: No Do you understand care options if your condition(s) worsen?: Yes-patient verbalized understanding    SIGNATURE Karena Addison, LPN [facility] Nurse Health Advisor Direct Dial [phone]

## 2022-11-23 ENCOUNTER — Inpatient Hospital Stay: Payer: 59 | Admitting: Family Medicine

## 2022-11-24 ENCOUNTER — Other Ambulatory Visit: Payer: Self-pay | Admitting: Family Medicine

## 2022-11-24 DIAGNOSIS — I10 Essential (primary) hypertension: Secondary | ICD-10-CM

## 2022-12-07 ENCOUNTER — Encounter: Payer: Self-pay | Admitting: Family Medicine

## 2022-12-07 ENCOUNTER — Ambulatory Visit: Payer: 59 | Admitting: Family Medicine

## 2022-12-07 VITALS — BP 118/66 | HR 74 | Ht 66.0 in | Wt 180.0 lb

## 2022-12-07 DIAGNOSIS — K2100 Gastro-esophageal reflux disease with esophagitis, without bleeding: Secondary | ICD-10-CM | POA: Diagnosis not present

## 2022-12-07 DIAGNOSIS — R002 Palpitations: Secondary | ICD-10-CM | POA: Diagnosis not present

## 2022-12-07 DIAGNOSIS — R110 Nausea: Secondary | ICD-10-CM | POA: Diagnosis not present

## 2022-12-07 DIAGNOSIS — I10 Essential (primary) hypertension: Secondary | ICD-10-CM | POA: Diagnosis not present

## 2022-12-07 MED ORDER — FAMOTIDINE 20 MG PO TABS
20.0000 mg | ORAL_TABLET | Freq: Two times a day (BID) | ORAL | 1 refills | Status: DC
Start: 2022-12-07 — End: 2023-01-05

## 2022-12-07 MED ORDER — SUCRALFATE 1 GM/10ML PO SUSP
1.0000 g | Freq: Three times a day (TID) | ORAL | 0 refills | Status: DC
Start: 2022-12-07 — End: 2024-02-16

## 2022-12-07 NOTE — Assessment & Plan Note (Signed)
With half a tab of the metoprolol.  Can always increase to a whole tab if needed.  Will go ahead and place cardiology referral today see if they can provide any assistance.Marland Kitchen

## 2022-12-07 NOTE — Patient Instructions (Signed)
Hold the omeprazole

## 2022-12-07 NOTE — Assessment & Plan Note (Signed)
Will change to pepcid 20 mg twice a day and add Carafate for symptom control.  Encouraged her to reach back out to GI to discuss further with her options since she was still having some breakthrough symptoms on the omeprazole and having some increased PVCs on the high-dose omeprazole.

## 2022-12-07 NOTE — Assessment & Plan Note (Signed)
BP great today.  °

## 2022-12-07 NOTE — Progress Notes (Signed)
Established Patient Office Visit  Subjective   Patient ID: Shiny Beckles, female    DOB: 07-Jun-1975  Age: 47 y.o. MRN: [id number]  Chief Complaint  Patient presents with   Follow-up         HPI  F/U recent Ed visit at [facility]  for palpitations. They did do a nuclear medicine stress test that was negative.  She also had a Zio patch that was placed in the emergency room.  She has been taking a half a tab of metoprolol and for the most part that seems to be helping to control the symptoms.  Had noticed more PVCs with omeprazole 40 mg twice a day.  So she decreased and discontinued the medication briefly and noticed that the PVCs did improve.  She tried taking some over-the-counter Pepcid but it just did not seem to work as effectively as omeprazole for treating her GERD.  Even with the omeprazole she still having some breakthrough reflux symptoms as well as some gas and bloating.  She still cannot really figure out what exactly is triggering and she is might try to make dietary changes as well.    ROS    Objective:     BP 118/66   Pulse 74   Ht 5\' 6"  (1.676 m)   Wt 180 lb (81.6 kg)   SpO2 95%   BMI 29.05 kg/m    Physical Exam Vitals and nursing note reviewed.  Constitutional:      Appearance: She is well-developed.  HENT:     Head: Normocephalic and atraumatic.  Cardiovascular:     Rate and Rhythm: Normal rate and regular rhythm.     Heart sounds: Normal heart sounds.  Pulmonary:     Effort: Pulmonary effort is normal.     Breath sounds: Normal breath sounds.  Skin:    General: Skin is warm and dry.  Neurological:     Mental Status: She is alert and oriented to person, place, and time.  Psychiatric:        Behavior: Behavior normal.      No results found for any visits on 12/07/22.    The 10-year ASCVD risk score (Arnett DK, et al., 2019) is: 1%    Assessment & Plan:   Problem List Items Addressed This Visit       Cardiovascular and Mediastinum    Essential (primary) hypertension    BP great today         Digestive   GERD (gastroesophageal reflux disease)    Will change to pepcid 20 mg twice a day and add Carafate for symptom control.  Encouraged her to reach back out to GI to discuss further with her options since she was still having some breakthrough symptoms on the omeprazole and having some increased PVCs on the high-dose omeprazole.      Relevant Medications   famotidine (PEPCID) 20 MG tablet   sucralfate (CARAFATE) 1 GM/10ML suspension     Other   Palpitations - Primary    With half a tab of the metoprolol.  Can always increase to a whole tab if needed.  Will go ahead and place cardiology referral today see if they can provide any assistance..      Relevant Orders   Ambulatory referral to Cardiology   Other Visit Diagnoses     Nausea       Relevant Medications   famotidine (PEPCID) 20 MG tablet   sucralfate (CARAFATE) 1 GM/10ML suspension  Return if symptoms worsen or fail to improve.    Nani Gasser, MD

## 2022-12-27 ENCOUNTER — Other Ambulatory Visit: Payer: Self-pay | Admitting: Family Medicine

## 2022-12-27 DIAGNOSIS — I10 Essential (primary) hypertension: Secondary | ICD-10-CM

## 2023-01-02 ENCOUNTER — Other Ambulatory Visit: Payer: Self-pay | Admitting: Family Medicine

## 2023-01-02 DIAGNOSIS — R110 Nausea: Secondary | ICD-10-CM

## 2023-01-02 DIAGNOSIS — K2100 Gastro-esophageal reflux disease with esophagitis, without bleeding: Secondary | ICD-10-CM

## 2023-01-06 ENCOUNTER — Ambulatory Visit: Payer: 59 | Admitting: Family Medicine

## 2023-01-28 ENCOUNTER — Other Ambulatory Visit: Payer: Self-pay | Admitting: Family Medicine

## 2023-03-05 ENCOUNTER — Other Ambulatory Visit: Payer: Self-pay | Admitting: Family Medicine

## 2023-03-05 DIAGNOSIS — I10 Essential (primary) hypertension: Secondary | ICD-10-CM

## 2023-05-03 ENCOUNTER — Encounter: Payer: Self-pay | Admitting: Physician Assistant

## 2023-05-03 ENCOUNTER — Ambulatory Visit: Payer: 59 | Admitting: Physician Assistant

## 2023-05-03 VITALS — BP 131/86 | HR 70 | Temp 97.8°F | Resp 14 | Ht 66.0 in | Wt 178.7 lb

## 2023-05-03 DIAGNOSIS — J0100 Acute maxillary sinusitis, unspecified: Secondary | ICD-10-CM

## 2023-05-03 MED ORDER — AMOXICILLIN-POT CLAVULANATE 875-125 MG PO TABS: 1.0000 | ORAL_TABLET | Freq: Two times a day (BID) | ORAL | 0 refills | Status: DC

## 2023-05-03 MED ORDER — BENZONATATE 200 MG PO CAPS: 200.0000 mg | ORAL_CAPSULE | Freq: Three times a day (TID) | ORAL | 0 refills | Status: DC | PRN

## 2023-05-03 NOTE — Progress Notes (Signed)
 Acute Office Visit  Subjective:     Patient ID: Megan Chavez, female    DOB: 03/08/1976, 47 y.o.   MRN: [id number]  Chief Complaint  Patient presents with   Sinus Problem    HPI Patient is in today for URI symptoms that have not improved. Symptoms for the last 2 weeks. She did symptomatic care with sudafed and helped some. She has lots of sinus drainage and pressure. She thought she was getting better and then got worse again.   .. Active Ambulatory Problems    Diagnosis Date Noted   Hypothyroidism due to Hashimoto's thyroiditis 03/04/2017   Overweight (BMI 25.0-29.9) 03/04/2017   Dyshidrotic eczema 07/18/2017   Pityriasis rosea 07/18/2017   Abdominal pain, right upper quadrant 08/18/2018   Abnormal finding on mammography 12/22/2017   Overactive bladder 04/03/2019   Elevated LDL cholesterol level 06/19/2019   Essential (primary) hypertension 09/25/2021   GERD (gastroesophageal reflux disease) 11/02/2022   Palpitations 11/02/2022   Resolved Ambulatory Problems    Diagnosis Date Noted   Acute maxillary sinusitis 12/05/2019   No Additional Past Medical History     ROS See HPI.      Objective:    BP 131/86 (BP Location: Right Arm, Patient Position: Sitting)   Pulse 70   Temp 97.8 F (36.6 C) (Oral)   Resp 14   Ht 5' 6 (1.676 m)   Wt 178 lb 11.2 oz (81.1 kg)   SpO2 100%   BMI 28.84 kg/m  BP Readings from Last 3 Encounters:  05/03/23 131/86  12/07/22 118/66  11/08/22 125/62   Wt Readings from Last 3 Encounters:  05/03/23 178 lb 11.2 oz (81.1 kg)  12/07/22 180 lb (81.6 kg)  11/08/22 184 lb (83.5 kg)      Physical Exam Constitutional:      Appearance: Normal appearance.  HENT:     Head: Normocephalic.     Comments: Tenderness over maxillary and frontal sinuses to palpation.     Right Ear: Tympanic membrane, ear canal and external ear normal. There is no impacted cerumen.     Left Ear: Tympanic membrane, ear canal and external ear normal. There is no  impacted cerumen.     Nose: Congestion present. No rhinorrhea.     Mouth/Throat:     Mouth: Mucous membranes are moist.     Pharynx: Posterior oropharyngeal erythema present. No oropharyngeal exudate.  Eyes:     Conjunctiva/sclera: Conjunctivae normal.  Cardiovascular:     Rate and Rhythm: Normal rate and regular rhythm.     Pulses: Normal pulses.  Pulmonary:     Effort: Pulmonary effort is normal.     Breath sounds: Normal breath sounds.  Musculoskeletal:     Cervical back: Normal range of motion and neck supple. No tenderness.  Lymphadenopathy:     Cervical: Cervical adenopathy present.  Neurological:     General: No focal deficit present.     Mental Status: She is alert and oriented to person, place, and time.  Psychiatric:        Mood and Affect: Mood normal.           Assessment & Plan:  SABRASABRAMio was seen today for sinus problem.  Diagnoses and all orders for this visit:  Acute non-recurrent maxillary sinusitis -     benzonatate  (TESSALON ) 200 MG capsule; Take 1 capsule (200 mg total) by mouth 3 (three) times daily as needed. -     amoxicillin -clavulanate (AUGMENTIN ) 875-125 MG tablet; Take 1 tablet  by mouth 2 (two) times daily.   Treated for sinusitis with augmentin  and flonase Tessalon  pearls for cough Follow up as needed if symptoms worsen or do not improve  Vermell Bologna, PA-C

## 2023-05-03 NOTE — Patient Instructions (Signed)

## 2023-05-14 ENCOUNTER — Other Ambulatory Visit: Payer: Self-pay | Admitting: Family Medicine

## 2023-05-14 DIAGNOSIS — I10 Essential (primary) hypertension: Secondary | ICD-10-CM

## 2023-05-23 LAB — HM PAP SMEAR

## 2023-06-07 ENCOUNTER — Ambulatory Visit: Payer: 59 | Admitting: Family Medicine

## 2023-06-07 ENCOUNTER — Encounter: Payer: Self-pay | Admitting: Family Medicine

## 2023-06-07 VITALS — BP 143/83 | HR 86 | Ht 66.0 in | Wt 183.5 lb

## 2023-06-07 DIAGNOSIS — B379 Candidiasis, unspecified: Secondary | ICD-10-CM

## 2023-06-07 DIAGNOSIS — R300 Dysuria: Secondary | ICD-10-CM

## 2023-06-07 DIAGNOSIS — T3695XA Adverse effect of unspecified systemic antibiotic, initial encounter: Secondary | ICD-10-CM

## 2023-06-07 DIAGNOSIS — N3000 Acute cystitis without hematuria: Secondary | ICD-10-CM | POA: Insufficient documentation

## 2023-06-07 LAB — POCT URINALYSIS DIP (CLINITEK)
Bilirubin, UA: NEGATIVE
Blood, UA: NEGATIVE
Glucose, UA: NEGATIVE mg/dL
Ketones, POC UA: NEGATIVE mg/dL
Leukocytes, UA: NEGATIVE
Nitrite, UA: POSITIVE — AB
POC PROTEIN,UA: NEGATIVE
Spec Grav, UA: 1.01 (ref 1.010–1.025)
Urobilinogen, UA: 0.2 U/dL
pH, UA: 7 (ref 5.0–8.0)

## 2023-06-07 MED ORDER — CIPROFLOXACIN HCL 500 MG PO TABS: 500.0000 mg | ORAL_TABLET | Freq: Every day | ORAL | 0 refills | Status: AC

## 2023-06-07 MED ORDER — FLUCONAZOLE 150 MG PO TABS: 150.0000 mg | ORAL_TABLET | Freq: Once | ORAL | 0 refills | Status: AC

## 2023-06-07 NOTE — Progress Notes (Signed)
 Acute Office Visit  Subjective:     Patient ID: Megan Chavez, female    DOB: [birth year], 48 y.o.   MRN: [id number]  Chief Complaint  Patient presents with   Urinary Tract Infection    Frequent urination, burning    HPI Patient is in today for concerns of dysuria. Was seen at [facility] two weeks ago in [place] and given macrobid. Unable to find any urine culture in chart, but have seen UA that was positive for nitrites and leukocytes. Pt continues to have frequency, burning, and R sided flank pain. BP slightly elevated today due to pain.   Review of Systems  Constitutional:  Negative for chills and fever.  Respiratory:  Negative for cough and shortness of breath.   Cardiovascular:  Negative for chest pain.  Genitourinary:  Positive for dysuria.  Neurological:  Negative for headaches.        Objective:    BP (!) 143/83 (BP Location: Left Arm, Patient Position: Sitting, Cuff Size: Large)   Pulse 86   Ht 5' 6 (1.676 m)   Wt 183 lb 8 oz (83.2 kg)   SpO2 100%   BMI 29.62 kg/m    Physical Exam Vitals and nursing note reviewed.  Constitutional:      General: She is not in acute distress.    Appearance: Normal appearance.  HENT:     Head: Normocephalic and atraumatic.     Right Ear: External ear normal.     Left Ear: External ear normal.     Nose: Nose normal.  Eyes:     Conjunctiva/sclera: Conjunctivae normal.  Cardiovascular:     Rate and Rhythm: Normal rate and regular rhythm.  Pulmonary:     Effort: Pulmonary effort is normal.     Breath sounds: Normal breath sounds.  Neurological:     General: No focal deficit present.     Mental Status: She is alert and oriented to person, place, and time.  Psychiatric:        Mood and Affect: Mood normal.        Behavior: Behavior normal.        Thought Content: Thought content normal.        Judgment: Judgment normal.     Results for orders placed or performed in visit on 06/07/23  POCT URINALYSIS DIP  (CLINITEK)  Result Value Ref Range   Color, UA yellow yellow   Clarity, UA clear clear   Glucose, UA negative negative mg/dL   Bilirubin, UA negative negative   Ketones, POC UA negative negative mg/dL   Spec Grav, UA 8.989 8.989 - 1.025   Blood, UA negative negative   pH, UA 7.0 5.0 - 8.0   POC PROTEIN,UA negative negative, trace   Urobilinogen, UA 0.2 0.2 or 1.0 E.U./dL   Nitrite, UA Positive (A) Negative   Leukocytes, UA Negative Negative        Assessment & Plan:   Problem List Items Addressed This Visit       Genitourinary   Acute cystitis without hematuria - Primary   Pt notes continued UTI symptoms repeat UA shows positive nitrites. Was previously on macrobid so we will go ahead and do cipro . Pt does have positive CVA tenderness on R  - pt gets yeast infection with antibiotic use, have sent in diflucan   - will culture urine      Relevant Medications   ciprofloxacin  (CIPRO ) 500 MG tablet   Other Visit Diagnoses  Dysuria       Relevant Orders   POCT URINALYSIS DIP (CLINITEK) (Completed)   Urine Culture     Antibiotic-induced yeast infection       Relevant Medications   fluconazole  (DIFLUCAN ) 150 MG tablet       Meds ordered this encounter  Medications   ciprofloxacin  (CIPRO ) 500 MG tablet    Sig: Take 1 tablet (500 mg total) by mouth daily with breakfast for 3 days.    Dispense:  3 tablet    Refill:  0   fluconazole  (DIFLUCAN ) 150 MG tablet    Sig: Take 1 tablet (150 mg total) by mouth once for 1 dose. If no better in 72 hours take second dose    Dispense:  2 tablet    Refill:  0    No follow-ups on file.  Bernice GORMAN Juneau, DO

## 2023-06-07 NOTE — Assessment & Plan Note (Signed)
 Pt notes continued UTI symptoms repeat UA shows positive nitrites. Was previously on macrobid so we will go ahead and do cipro . Pt does have positive CVA tenderness on R  - pt gets yeast infection with antibiotic use, have sent in diflucan   - will culture urine

## 2023-06-11 LAB — URINE CULTURE

## 2023-06-12 ENCOUNTER — Encounter: Payer: Self-pay | Admitting: Family Medicine

## 2023-07-13 ENCOUNTER — Other Ambulatory Visit: Payer: Self-pay | Admitting: Family Medicine

## 2023-07-13 DIAGNOSIS — I10 Essential (primary) hypertension: Secondary | ICD-10-CM

## 2023-07-31 ENCOUNTER — Other Ambulatory Visit: Payer: Self-pay | Admitting: Family Medicine

## 2023-08-04 ENCOUNTER — Encounter: Payer: Self-pay | Admitting: Family Medicine

## 2023-08-04 ENCOUNTER — Ambulatory Visit (INDEPENDENT_AMBULATORY_CARE_PROVIDER_SITE_OTHER): Admitting: Family Medicine

## 2023-08-04 VITALS — BP 109/75 | HR 64 | Ht 66.0 in | Wt 182.0 lb

## 2023-08-04 DIAGNOSIS — E063 Autoimmune thyroiditis: Secondary | ICD-10-CM

## 2023-08-04 DIAGNOSIS — K2100 Gastro-esophageal reflux disease with esophagitis, without bleeding: Secondary | ICD-10-CM

## 2023-08-04 DIAGNOSIS — Z0000 Encounter for general adult medical examination without abnormal findings: Secondary | ICD-10-CM | POA: Diagnosis not present

## 2023-08-04 NOTE — Addendum Note (Signed)
 Addended by: Nani Gasser D on: 08/04/2023 12:09 PM   Modules accepted: Orders

## 2023-08-04 NOTE — Assessment & Plan Note (Signed)
 Gave me an update today still working with GI they think she could be having some spasms and they wanted do an additional test/procedure.  Right now they have discontinued her Pepcid and she is on Nexium and hyoscyamine.

## 2023-08-04 NOTE — Assessment & Plan Note (Signed)
 Increase levothyroxine to half a tab 2 days a week a whole tab 5 days a week and then recheck TSH in 6 to 8 weeks.  She has always felt better when her TSH is under 2 and closer to 1.  She can let us know if she needs refills at the local pharmacy.  If we can push the TSH down just a little bit more after next check we could always switch to the 88 mcg daily for difference of 60 mcg total.

## 2023-08-04 NOTE — Progress Notes (Signed)
 Complete physical exam  Patient: Megan Chavez   DOB: [birth year]   47 y.o. Female  MRN: [id number]  Subjective:    Chief Complaint  Patient presents with   Annual Exam    Alani Sabbagh is a 48 y.o. female who presents today for a complete physical exam. She reports consuming a general diet.  Walking on the treadmill.   She generally feels well. Busy time of year with work and kids. . She does not have additional problems to discuss today.   Currently taking 75 mcg 6 days a week and 1-1/2 tabs 1 day a week.  Most recent fall risk assessment:    10/07/2022   11:02 AM  Fall Risk   Falls in the past year? 0  Number falls in past yr: 0  Injury with Fall? 0  Risk for fall due to : No Fall Risks  Follow up Falls evaluation completed     Most recent depression screenings:    10/07/2022   11:02 AM 04/15/2022   10:15 AM  PHQ 2/9 Scores  PHQ - 2 Score 0 0         Patient Care Team: Agapito Games, MD as PCP - General (Family Medicine) Orson Aloe Girtha Rm, MD as Referring Physician (Endocrinology)   Outpatient Medications Prior to Visit  Medication Sig   esomeprazole (NEXIUM) 40 MG capsule Take 40 mg by mouth in the morning.   hyoscyamine (LEVSIN SL) 0.125 MG SL tablet Take 0.125 mg by mouth in the morning and at bedtime.   levothyroxine (SYNTHROID) 75 MCG tablet Take 1 tablet by mouth daily.   losartan (COZAAR) 50 MG tablet TAKE 1 TABLET BY MOUTH EVERY DAY   sucralfate (CARAFATE) 1 GM/10ML suspension Take 10 mLs (1 g total) by mouth 4 (four) times daily -  with meals and at bedtime.   [DISCONTINUED] metoprolol succinate (TOPROL-XL) 25 MG 24 hr tablet TAKE 1 TABLET (25 MG TOTAL) BY MOUTH DAILY.   famotidine (PEPCID) 20 MG tablet TAKE 1 TABLET BY MOUTH TWICE A DAY (Patient not taking: Reported on 08/04/2023)   [DISCONTINUED] amoxicillin-clavulanate (AUGMENTIN) 875-125 MG tablet Take 1 tablet by mouth 2 (two) times daily. (Patient not taking: Reported on 08/04/2023)    [DISCONTINUED] benzonatate (TESSALON) 200 MG capsule Take 1 capsule (200 mg total) by mouth 3 (three) times daily as needed. (Patient not taking: Reported on 08/04/2023)   No facility-administered medications prior to visit.    ROS        Objective:     BP 109/75 (BP Location: Left Arm, Patient Position: Sitting, Cuff Size: Large)   Pulse 64   Ht 5\' 6"  (1.676 m)   Wt 182 lb (82.6 kg)   SpO2 99%   BMI 29.38 kg/m     Physical Exam Constitutional:      Appearance: Normal appearance.  HENT:     Head: Normocephalic and atraumatic.     Right Ear: Tympanic membrane, ear canal and external ear normal.     Left Ear: Tympanic membrane, ear canal and external ear normal.     Nose: Nose normal.     Mouth/Throat:     Pharynx: Oropharynx is clear.  Eyes:     Extraocular Movements: Extraocular movements intact.     Conjunctiva/sclera: Conjunctivae normal.     Pupils: Pupils are equal, round, and reactive to light.  Neck:     Thyroid: No thyromegaly.  Cardiovascular:     Rate and Rhythm: Normal rate and regular rhythm.  Pulmonary:     Effort: Pulmonary effort is normal.     Breath sounds: Normal breath sounds.  Abdominal:     General: Bowel sounds are normal.     Palpations: Abdomen is soft.     Tenderness: There is no abdominal tenderness.  Musculoskeletal:        General: No swelling.     Cervical back: Neck supple.  Skin:    General: Skin is warm and dry.  Neurological:     Mental Status: She is oriented to person, place, and time.  Psychiatric:        Mood and Affect: Mood normal.        Behavior: Behavior normal.      No results found for any visits on 08/04/23.     Assessment & Plan:    Routine Health Maintenance and Physical Exam  Immunization History  Administered Date(s) Administered   Influenza Inj Mdck Quad Pf 01/31/2014, 01/31/2017, 03/03/2017   Influenza,inj,Quad PF,6+ Mos 04/05/2018, 04/03/2019, 05/07/2020   Influenza-Unspecified 01/31/2014,  01/31/2017, 03/03/2017   Janssen (J&J) SARS-COV-2 Vaccination 08/12/2019   PFIZER(Purple Top)SARS-COV-2 Vaccination 04/29/2020   Tdap 07/18/2017    Health Maintenance  Topic Date Due   HIV Screening  Never done   Hepatitis C Screening  Never done   COVID-19 Vaccine (3 - 2024-25 season) 01/02/2023   INFLUENZA VACCINE  12/02/2023   MAMMOGRAM  02/09/2024   Fecal DNA (Cologuard)  11/05/2024   Cervical Cancer Screening (HPV/Pap Cotest)  05/22/2026   DTaP/Tdap/Td (2 - Td or Tdap) 07/19/2027   HPV VACCINES  Aged Out    Discussed health benefits of physical activity, and encouraged her to engage in regular exercise appropriate for her age and condition.  Problem List Items Addressed This Visit       Digestive   GERD (gastroesophageal reflux disease)   Gave me an update today still working with GI they think she could be having some spasms and they wanted do an additional test/procedure.  Right now they have discontinued her Pepcid and she is on Nexium and hyoscyamine.      Relevant Medications   esomeprazole (NEXIUM) 40 MG capsule   hyoscyamine (LEVSIN SL) 0.125 MG SL tablet     Endocrine   Hypothyroidism due to Hashimoto's thyroiditis   Increase levothyroxine to half a tab 2 days a week a whole tab 5 days a week and then recheck TSH in 6 to 8 weeks.  She has always felt better when her TSH is under 2 and closer to 1.  She can let us know if she needs refills at the local pharmacy.  If we can push the TSH down just a little bit more after next check we could always switch to the 88 mcg daily for difference of 60 mcg total.      Other Visit Diagnoses       Wellness examination    -  Primary   Relevant Orders   CBC   Basic Metabolic Panel (BMET)   Lipid panel   Hepatitis C Antibody      Return in about 6 months (around 02/03/2024) for Hypertension.     Nani Gasser, MD

## 2023-08-05 ENCOUNTER — Encounter: Payer: Self-pay | Admitting: Family Medicine

## 2023-08-05 LAB — LIPID PANEL
Chol/HDL Ratio: 3.6 ratio (ref 0.0–4.4)
Cholesterol, Total: 195 mg/dL (ref 100–199)
HDL: 54 mg/dL (ref >39)
LDL Chol Calc (NIH): 121 mg/dL — ABNORMAL HIGH (ref 0–99)
Triglycerides: 110 mg/dL (ref 0–149)
VLDL Cholesterol Cal: 20 mg/dL (ref 5–40)

## 2023-08-05 LAB — CMP14+EGFR
ALT: 17 IU/L (ref 0–32)
AST: 17 IU/L (ref 0–40)
Albumin: 4.7 g/dL (ref 3.9–4.9)
Alkaline Phosphatase: 94 IU/L (ref 44–121)
BUN/Creatinine Ratio: 17 (ref 9–23)
BUN: 12 mg/dL (ref 6–24)
Bilirubin Total: 0.3 mg/dL (ref 0.0–1.2)
CO2: 21 mmol/L (ref 20–29)
Calcium: 9.5 mg/dL (ref 8.7–10.2)
Chloride: 103 mmol/L (ref 96–106)
Creatinine, Ser: 0.72 mg/dL (ref 0.57–1.00)
Globulin, Total: 2.2 g/dL (ref 1.5–4.5)
Glucose: 84 mg/dL (ref 70–99)
Potassium: 4.3 mmol/L (ref 3.5–5.2)
Sodium: 140 mmol/L (ref 134–144)
Total Protein: 6.9 g/dL (ref 6.0–8.5)
eGFR: 103 mL/min/{1.73_m2} (ref >59)

## 2023-08-05 LAB — CBC
Hematocrit: 37.1 % (ref 34.0–46.6)
Hemoglobin: 11.3 g/dL (ref 11.1–15.9)
MCH: 23.8 pg — ABNORMAL LOW (ref 26.6–33.0)
MCHC: 30.5 g/dL — ABNORMAL LOW (ref 31.5–35.7)
MCV: 78 fL — ABNORMAL LOW (ref 79–97)
Platelets: 305 10*3/uL (ref 150–450)
RBC: 4.74 x10E6/uL (ref 3.77–5.28)
RDW: 14 % (ref 11.7–15.4)
WBC: 4.5 10*3/uL (ref 3.4–10.8)

## 2023-08-05 LAB — HEPATITIS C ANTIBODY: Hep C Virus Ab: NONREACTIVE

## 2023-08-05 NOTE — Progress Notes (Signed)
 Hi Megan Chavez, your red blood cells are small so we will call the lab and have them add iron panel.  LDL cholesterol is up a little, work on [employer] and exercise. Metabolic panel is normal Negative for Hep C.

## 2023-08-06 LAB — IRON AND TIBC
Iron Saturation: 8 % — CL (ref 15–55)
Iron: 39 ug/dL (ref 27–159)
Total Iron Binding Capacity: 492 ug/dL — ABNORMAL HIGH (ref 250–450)
UIBC: 453 ug/dL — ABNORMAL HIGH (ref 131–425)

## 2023-08-06 LAB — FERRITIN: Ferritin: 7 ng/mL — ABNORMAL LOW (ref 15–150)

## 2023-08-06 LAB — SPECIMEN STATUS REPORT

## 2023-08-08 NOTE — Progress Notes (Signed)
 Hi Megan Chavez, your total iron is low and your iron saturation is really low.  Which indicates that you are iron deficient.  Ferritin which is a reflection of your iron stores are also low.  These remind me, are you having heavy periods?  Or are you noticing any blood in the urine or stool?  Are you currently taking any extra iron?

## 2023-08-09 NOTE — Progress Notes (Signed)
 Okay, lets start with just taking an over-the-counter iron supplement.  There is different versions out there just pick 1 that you feel comfortable with and that you think you can take daily.  If you feel like is causing some constipation and you need to take a softener with it please feel free to do that.  If you are tolerating it well then try to increase up to twice a day if you are able to.  I want to recheck your lab levels in about 6 to 8 weeks to make sure that it is improving it would probably take a little longer than that to get your levels completely normal but I do want to make sure that your bowel and gut is absorbing it and that were starting to see an improvement in your numbers.  Also work on eating iron rich foods.  This does typically come from meat but there are a lot of foods that are also fortified with iron and even cooking and an iron skillet can be helpful.

## 2023-08-10 NOTE — Progress Notes (Signed)
 Ferrous sulfate 65mg  is good or Slow FE is a good one too

## 2023-08-30 ENCOUNTER — Ambulatory Visit: Admitting: Family Medicine

## 2023-08-30 ENCOUNTER — Encounter: Payer: Self-pay | Admitting: Family Medicine

## 2023-08-30 VITALS — BP 135/79 | Ht 66.0 in | Wt 184.0 lb

## 2023-08-30 DIAGNOSIS — L255 Unspecified contact dermatitis due to plants, except food: Secondary | ICD-10-CM | POA: Insufficient documentation

## 2023-08-30 DIAGNOSIS — R7989 Other specified abnormal findings of blood chemistry: Secondary | ICD-10-CM | POA: Insufficient documentation

## 2023-08-30 MED ORDER — PREDNISONE 10 MG (48) PO TBPK
ORAL_TABLET | Freq: Every day | ORAL | 0 refills | Status: DC
Start: 2023-08-30 — End: 2024-02-16

## 2023-08-30 NOTE — Patient Instructions (Signed)
 Poison Ivy Dermatitis Poison ivy dermatitis is redness and soreness of the skin caused by chemicals in the leaves of the poison ivy plant. You may have very bad itching, swelling, a rash, and blisters. What are the causes? Touching a poison ivy plant. Touching something that has the chemical on it. This may include animals or objects that have come in contact with the plant. What increases the risk? Going outdoors often in wooded or [place] areas. Going outdoors without wearing protective clothing, such as closed shoes, long pants, and a long-sleeved shirt. What are the signs or symptoms?  Skin redness. Very bad itching. A rash that often includes bumps and blisters. The rash usually appears 48 hours after exposure, if you have had it before. If this is the first time you have it, the rash may not appear until a week after exposure. Swelling. This may occur if the reaction is very bad. Symptoms usually last for 1-2 weeks. The first time you get this condition, symptoms may last 3-4 weeks. How is this treated? This condition may be treated with: Hydrocortisone cream or calamine lotion to relieve itching. Oatmeal baths to soothe the skin. Medicines, such as over-the-counter antihistamine tablets. Oral steroid medicine for very bad reactions. Follow these instructions at home: Medicines Take or apply over-the-counter and prescription medicines only as told by your doctor. Use hydrocortisone cream or calamine lotion as needed to help with itching. General instructions Do not scratch or rub your skin. Put a cold, wet cloth (cold compress) on the affected areas or take baths in cool water. This will help with itching. Avoid hot baths and showers. Take oatmeal baths as needed. Use colloidal oatmeal. You can get this at a pharmacy or grocery store. Follow the instructions on the package. While you have the rash, wash your clothes right after you wear them. Check the affected area every day  for signs of infection. Check for: More redness, swelling, or pain. Fluid or blood. Warmth. Pus or a bad smell. Keep all follow-up visits. Your doctor may want to see how your skin is doing with treatment. How is this prevented?  Know what poison ivy looks like, so you can avoid it. This plant has three leaves with flowering branches on a single stem. The leaves are glossy. The leaves have uneven edges that come to a point. If you touch poison ivy, wash your skin with soap and water right away. Be sure to wash under your fingernails. When hiking or camping, wear long pants, a long-sleeved shirt, long socks, and hiking boots. You can also use a lotion on your skin that helps to prevent contact with poison ivy. If you think that your clothes or outdoor gear came in contact with poison ivy, rinse them off with a garden hose before you bring them inside your house. When doing yard work or gardening, wear gloves, long sleeves, long pants, and boots. Wash your garden tools and gloves if they come in contact with poison ivy. If you think that your pet has come into contact with poison ivy, wash them with pet shampoo and water. Make sure to wear gloves while washing your pet. Contact a doctor if: You have open sores in the rash area. You have any signs of infection. You have redness that spreads past the rash area. You have a fever. You have a rash over a large area of your body. You have a rash on your eyes, mouth, or genitals. Your rash does not get better after  a few weeks. Get help right away if: Your face swells or your eyes swell shut. You have trouble breathing. You have trouble swallowing. These symptoms may be an emergency. Do not wait to see if the symptoms will go away. Get help right away. Call 911. This information is not intended to replace advice given to you by your health care provider. Make sure you discuss any questions you have with your health care provider. Document  Revised: 09/17/2021 Document Reviewed: 09/17/2021 Elsevier Patient Education  2024 [employer].

## 2023-08-30 NOTE — Assessment & Plan Note (Signed)
 Treating with 12 day taper of prednisone .  Red flags and signs of infection reviewed.  Contact clinic if not improving.

## 2023-08-30 NOTE — Progress Notes (Signed)
 Megan Chavez - 48 y.o. female MRN [id number]  Date of birth: 1975-10-17  Subjective Chief Complaint  Patient presents with   Rash    HPI Megan Chavez is a 47 y.o. female here today with complaint of rash on bilateral arms.  She was working in the yard at her mothers this past weekend and was pulling weeds.  May have come across poison ivy but is isn't sure.  Also was moving some boxes and exposed to dust as well.  Rash is itchy.  She has tried benadryl with minor relief.  Denies pain or fever.   ROS:  A comprehensive ROS was completed and negative except as noted per HPI  Allergies  Allergen Reactions   Dust Mite Extract Other (See Comments)   Molds & Smuts     Other reaction(s): Cough, Cough (ALLERGY/intolerance)    Past Medical History:  Diagnosis Date   Hypothyroidism due to Hashimoto's thyroiditis 03/04/2017    Past Surgical History:  Procedure Laterality Date   CESAREAN SECTION  10/2000   CESAREAN SECTION  02/2003   CESAREAN SECTION  06/2006    Social History   Socioeconomic History   Marital status: Married    Spouse name: Jaisley Synnott   Number of children: 3   Years of education: Not on file   Highest education level: Not on file  Occupational History   Occupation: Gaffer    Comment: [employer]   Tobacco Use   Smoking status: Former    Current packs/day: 0.00    Types: Cigarettes    Quit date: 03/10/2000    Years since quitting: 23.4   Smokeless tobacco: Never  Substance and Sexual Activity   Alcohol use: Yes    Comment: Alcohol occ   Drug use: No   Sexual activity: Yes  Other Topics Concern   Not on file  Social History Narrative   She exercises 2-3 days/week for about 30 minutes.  1-2 caffeinated beverages daily.   Social Drivers of Corporate investment banker Strain: Low Risk  (12/15/2022)   Received [facility]   Overall Financial Resource Strain (CARDIA)    Difficulty of Paying Living Expenses: Not hard at all  Food  Insecurity: Low Risk  (05/27/2023)   Received from [facility]   Hunger Vital Sign    Worried About Running Out of Food in the Last Year: Never true    Ran Out of Food in the Last Year: Never true  Transportation Needs: No Transportation Needs (05/27/2023)   Received from [facility]    In the past 12 months, has lack of reliable transportation kept you from medical appointments, meetings, work or from getting things needed for daily living? : No  Physical Activity: Sufficiently Active (12/15/2022)   Received from [facility]   Exercise Vital Sign    Days of Exercise per Week: 6 days    Minutes of Exercise per Session: 40 min  Stress: Stress Concern Present (12/15/2022)   Received from [facility] - Occupational Stress Questionnaire    Feeling of Stress : To some extent  Social Connections: Moderately Integrated (12/15/2022)   Received from [facility]   Social Network    How would you rate your social network (family, work, friends)?: Adequate participation with social networks    Family History  Problem Relation Age of Onset   Stroke Father    Hypertension Father    Hashimoto's thyroiditis  Sister    Hypothyroidism Sister    Leukemia Daughter     Health Maintenance  Topic Date Due   HIV Screening  Never done   COVID-19 Vaccine (3 - 2024-25 season) 01/02/2023   INFLUENZA VACCINE  12/02/2023   MAMMOGRAM  02/09/2024   Fecal DNA (Cologuard)  11/05/2024   Cervical Cancer Screening (HPV/Pap Cotest)  05/22/2026   DTaP/Tdap/Td (2 - Td or Tdap) 07/19/2027   Hepatitis C Screening  Completed   HPV VACCINES  Aged Out   Meningococcal B Vaccine  Aged Out     ----------------------------------------------------------------------------------------------------------------------------------------------------------------------------------------------------------------- Physical Exam BP 135/79 (BP Location: Left Arm,  Patient Position: Sitting, Cuff Size: Normal)   Ht 5\' 6"  (1.676 m)   Wt 184 lb (83.5 kg)   BMI 29.70 kg/m   Physical Exam Constitutional:      Appearance: Normal appearance.  HENT:     Head: Normocephalic and atraumatic.  Skin:    Comments: Scattered, raised vesicular lesion in linear pattern on bilateral forearms.   Neurological:     Mental Status: She is alert.     ------------------------------------------------------------------------------------------------------------------------------------------------------------------------------------------------------------------- Assessment and Plan  Rhus dermatitis Treating with 12 day taper of prednisone .  Red flags and signs of infection reviewed.  Contact clinic if not improving.    Meds ordered this encounter  Medications   predniSONE  (STERAPRED UNI-PAK 48 TAB) 10 MG (48) TBPK tablet    Sig: Take by mouth daily. 12-day taper pack, use as directed for taper    Dispense:  48 tablet    Refill:  0    No follow-ups on file.

## 2023-10-11 LAB — HM MAMMOGRAPHY

## 2023-10-17 ENCOUNTER — Encounter: Payer: Self-pay | Admitting: Family Medicine

## 2023-12-14 ENCOUNTER — Other Ambulatory Visit: Payer: Self-pay | Admitting: Family Medicine

## 2023-12-14 DIAGNOSIS — I10 Essential (primary) hypertension: Secondary | ICD-10-CM

## 2024-01-05 ENCOUNTER — Telehealth: Admitting: Physician Assistant

## 2024-01-05 DIAGNOSIS — U071 COVID-19: Secondary | ICD-10-CM

## 2024-01-05 MED ORDER — NIRMATRELVIR/RITONAVIR (PAXLOVID)TABLET: 3.0000 | ORAL_TABLET | Freq: Two times a day (BID) | ORAL | 0 refills | Status: AC

## 2024-01-05 NOTE — Progress Notes (Signed)
 Virtual Visit Consent   Megan Chavez, you are scheduled for a virtual visit with a [facility] provider today. Just as with appointments in the office, your consent must be obtained to participate. Your consent will be active for this visit and any virtual visit you may have with one of our providers in the next 365 days. If you have a MyChart account, a copy of this consent can be sent to you electronically.  As this is a virtual visit, video technology does not allow for your provider to perform a traditional examination. This may limit your provider's ability to fully assess your condition. If your provider identifies any concerns that need to be evaluated in person or the need to arrange testing (such as labs, EKG, etc.), we will make arrangements to do so. Although advances in technology are sophisticated, we cannot ensure that it will always work on either your end or our end. If the connection with a video visit is poor, the visit may have to be switched to a telephone visit. With either a video or telephone visit, we are not always able to ensure that we have a secure connection.  By engaging in this virtual visit, you consent to the provision of healthcare and authorize for your insurance to be billed (if applicable) for the services provided during this visit. Depending on your insurance coverage, you may receive a charge related to this service.  I need to obtain your verbal consent now. Are you willing to proceed with your visit today? Megan Chavez has provided verbal consent on 01/05/2024 for a virtual visit (video or telephone). Megan [name], PA-C  Date: 01/05/2024 2:36 PM   Virtual Visit via Video Note   I, Megan Chavez, connected with  Megan Chavez  ([id number], [birth year]) on 01/[birth year] at  2:30 PM EDT by a video-enabled telemedicine application and verified that I am speaking with the correct person using two identifiers.  Location: Patient: Virtual Visit Location Patient:  Mobile Provider: Virtual Visit Location Provider: Home Office   I discussed the limitations of evaluation and management by telemedicine and the availability of in person appointments. The patient expressed understanding and agreed to proceed.    History of Present Illness: Megan Chavez is a 48 y.o. who identifies as a female who was assigned female at birth, and is being seen today for Covid 36.  HPI: URI  This is a new problem. Episode onset: Tested positive for Covid on at home test; Symptoms started today. The problem has been gradually worsening. There has been no fever. Associated symptoms include congestion, coughing (mild), diarrhea, headaches, sinus pain and a sore throat. Pertinent negatives include no chest pain, ear pain, nausea, plugged ear sensation, rhinorrhea, vomiting or wheezing. Associated symptoms comments: Body aches, fatigue. She has tried increased fluids and NSAIDs for the symptoms. The treatment provided no relief.     Problems:  Patient Active Problem List   Diagnosis Date Noted   High thyroid  stimulating hormone (TSH) level 08/30/2023   Rhus dermatitis 08/30/2023   Acute cystitis without hematuria 06/07/2023   GERD (gastroesophageal reflux disease) 11/02/2022   Palpitations 11/02/2022   Essential (primary) hypertension 09/25/2021   Subclinical hypothyroidism 12/03/2020   Elevated LDL cholesterol level 06/19/2019   Overactive bladder 04/03/2019   Abdominal pain, right upper quadrant 08/18/2018   Abnormal finding on mammography 12/22/2017   Dyshidrotic eczema 07/18/2017    Class: History of   Pityriasis rosea 07/18/2017   Hypothyroidism due to Hashimoto's thyroiditis 03/04/2017  Overweight (BMI 25.0-29.9) 03/04/2017    Allergies:  Allergies  Allergen Reactions   Dust Mite Extract Other (See Comments)   Molds & Smuts     Other reaction(s): Cough, Cough (ALLERGY/intolerance)   Medications:  Current Outpatient Medications:    nirmatrelvir /ritonavir   (PAXLOVID ) 20 x 150 MG & 10 x 100MG  TABS, Take 3 tablets by mouth 2 (two) times daily for 5 days. (Take nirmatrelvir  150 mg two tablets twice daily for 5 days and ritonavir  100 mg one tablet twice daily for 5 days) Patient GFR is 103, Disp: 30 tablet, Rfl: 0   esomeprazole (NEXIUM) 40 MG capsule, Take 40 mg by mouth in the morning., Disp: , Rfl:    famotidine  (PEPCID ) 20 MG tablet, TAKE 1 TABLET BY MOUTH TWICE A DAY (Patient not taking: Reported on 08/30/2023), Disp: 180 tablet, Rfl: 1   hyoscyamine (LEVSIN SL) 0.125 MG SL tablet, Take 0.125 mg by mouth in the morning and at bedtime., Disp: , Rfl:    levothyroxine (SYNTHROID) 75 MCG tablet, Take 1 tablet by mouth daily., Disp: , Rfl:    losartan  (COZAAR ) 50 MG tablet, TAKE 1 TABLET BY MOUTH EVERY DAY, Disp: 90 tablet, Rfl: 1   predniSONE  (STERAPRED UNI-PAK 48 TAB) 10 MG (48) TBPK tablet, Take by mouth daily. 12-day taper pack, use as directed for taper, Disp: 48 tablet, Rfl: 0   sucralfate  (CARAFATE ) 1 GM/10ML suspension, Take 10 mLs (1 g total) by mouth 4 (four) times daily -  with meals and at bedtime. (Patient not taking: Reported on 08/30/2023), Disp: 420 mL, Rfl: 0  Observations/Objective: Patient is well-developed, well-nourished in no acute distress.  Resting comfortably Head is normocephalic, atraumatic.  No labored breathing.  Speech is clear and coherent with logical content.  Patient is alert and oriented at baseline.    Assessment and Plan: 1. COVID (Primary) - nirmatrelvir /ritonavir  (PAXLOVID ) 20 x 150 MG & 10 x 100MG  TABS; Take 3 tablets by mouth 2 (two) times daily for 5 days. (Take nirmatrelvir  150 mg two tablets twice daily for 5 days and ritonavir  100 mg one tablet twice daily for 5 days) Patient GFR is 103  Dispense: 30 tablet; Refill: 0  - Continue OTC symptomatic management of choice - Will send OTC vitamins and supplement information through AVS - Paxlovid  prescribed - Patient enrolled in MyChart symptom monitoring -  Push fluids - Rest as needed - Discussed return precautions and when to seek in-person evaluation, sent via AVS as well   Follow Up Instructions: I discussed the assessment and treatment plan with the patient. The patient was provided an opportunity to ask questions and all were answered. The patient agreed with the plan and demonstrated an understanding of the instructions.  A copy of instructions were sent to the patient via MyChart unless otherwise noted below.    The patient was advised to call back or seek an in-person evaluation if the symptoms worsen or if the condition fails to improve as anticipated.    Megan [name], PA-C

## 2024-01-05 NOTE — Patient Instructions (Signed)
 Megan Chavez, thank you for joining Megan [name], PA-C for today's virtual visit.  While this provider is not your primary care provider (PCP), if your PCP is located in our provider database this encounter information will be shared with them immediately following your visit.   A [facility] MyChart account gives you access to today's visit and all your visits, tests, and labs performed at [facility]  click here if you don't have a [facility] MyChart account or go to [email or web address]  Consent: (Patient) Megan Chavez provided verbal consent for this virtual visit at the beginning of the encounter.  Current Medications:  Current Outpatient Medications:    nirmatrelvir /ritonavir  (PAXLOVID ) 20 x 150 MG & 10 x 100MG  TABS, Take 3 tablets by mouth 2 (two) times daily for 5 days. (Take nirmatrelvir  150 mg two tablets twice daily for 5 days and ritonavir  100 mg one tablet twice daily for 5 days) Patient GFR is 103, Disp: 30 tablet, Rfl: 0   esomeprazole (NEXIUM) 40 MG capsule, Take 40 mg by mouth in the morning., Disp: , Rfl:    famotidine  (PEPCID ) 20 MG tablet, TAKE 1 TABLET BY MOUTH TWICE A DAY (Patient not taking: Reported on 08/30/2023), Disp: 180 tablet, Rfl: 1   hyoscyamine (LEVSIN SL) 0.125 MG SL tablet, Take 0.125 mg by mouth in the morning and at bedtime., Disp: , Rfl:    levothyroxine (SYNTHROID) 75 MCG tablet, Take 1 tablet by mouth daily., Disp: , Rfl:    losartan  (COZAAR ) 50 MG tablet, TAKE 1 TABLET BY MOUTH EVERY DAY, Disp: 90 tablet, Rfl: 1   predniSONE  (STERAPRED UNI-PAK 48 TAB) 10 MG (48) TBPK tablet, Take by mouth daily. 12-day taper pack, use as directed for taper, Disp: 48 tablet, Rfl: 0   sucralfate  (CARAFATE ) 1 GM/10ML suspension, Take 10 mLs (1 g total) by mouth 4 (four) times daily -  with meals and at bedtime. (Patient not taking: Reported on 08/30/2023), Disp: 420 mL, Rfl: 0   Medications ordered in this encounter:  Meds ordered this encounter   Medications   nirmatrelvir /ritonavir  (PAXLOVID ) 20 x 150 MG & 10 x 100MG  TABS    Sig: Take 3 tablets by mouth 2 (two) times daily for 5 days. (Take nirmatrelvir  150 mg two tablets twice daily for 5 days and ritonavir  100 mg one tablet twice daily for 5 days) Patient GFR is 103    Dispense:  30 tablet    Refill:  0    Supervising Provider:   BLAISE ALEENE KIDD [phone]     *If you need refills on other medications prior to your next appointment, please contact your pharmacy*  Follow-Up: Call back or seek an in-person evaluation if the symptoms worsen or if the condition fails to improve as anticipated.  [facility] [phone]  Care Instructions: Can take to lessen severity (if able): Vit C 500mg  twice daily Quercertin 250-500mg  twice daily Zinc 75-100mg  daily Melatonin 3-6 mg at bedtime Vit D3 1000-2000 IU daily Aspirin 81 mg daily with food Optional: Famotidine  20mg  daily Also can add tylenol/ibuprofen as needed for fevers and body aches May add Mucinex or Mucinex DM as needed for cough/congestion   Isolation Instructions: You are to isolate at home until you have been fever free for at least 24 hours without a fever-reducing medication, and symptoms have been steadily improving for 24 hours. At that time,  you can end isolation but need to mask for an additional 5 days.   If you  must be around other household members who do not have symptoms, you need to make sure that both you and the family members are masking consistently with a high-quality mask.  If you note any worsening of symptoms despite treatment, please seek an in-person evaluation ASAP. If you note any significant shortness of breath or any chest pain, please seek ER evaluation. Please do not delay care!   COVID-19: What to Do if You Are Sick If you test positive and are an older adult or someone who is at high risk of getting very sick from COVID-19, treatment may be available. Contact a healthcare  provider right away after a positive test to determine if you are eligible, even if your symptoms are mild right now. You can also visit a Test to Treat location and, if eligible, receive a prescription from a provider. Don't delay: Treatment must be started within the first few days to be effective. If you have a fever, cough, or other symptoms, you might have COVID-19. Most people have mild illness and are able to recover at home. If you are sick: Keep track of your symptoms. If you have an emergency warning sign (including trouble breathing), call 911. Steps to help prevent the spread of COVID-19 if you are sick If you are sick with COVID-19 or think you might have COVID-19, follow the steps below to care for yourself and to help protect other people in your home and community. Stay home except to get medical care Stay home. Most people with COVID-19 have mild illness and can recover at home without medical care. Do not leave your home, except to get medical care. Do not visit public areas and do not go to places where you are unable to wear a mask. Take care of yourself. Get rest and stay hydrated. Take over-the-counter medicines, such as acetaminophen, to help you feel better. Stay in touch with your doctor. Call before you get medical care. Be sure to get care if you have trouble breathing, or have any other emergency warning signs, or if you think it is an emergency. Avoid public transportation, ride-sharing, or taxis if possible. Get tested If you have symptoms of COVID-19, get tested. While waiting for test results, stay away from others, including staying apart from those living in your household. Get tested as soon as possible after your symptoms start. Treatments may be available for people with COVID-19 who are at risk for becoming very sick. Don't delay: Treatment must be started early to be effective--some treatments must begin within 5 days of your first symptoms. Contact your healthcare  provider right away if your test result is positive to determine if you are eligible. Self-tests are one of several options for testing for the virus that causes COVID-19 and may be more convenient than laboratory-based tests and point-of-care tests. Ask your healthcare provider or your local health department if you need help interpreting your test results. You can visit your state, tribal, local, and territorial health department's website to look for the latest local information on testing sites. Separate yourself from other people As much as possible, stay in a specific room and away from other people and pets in your home. If possible, you should use a separate bathroom. If you need to be around other people or animals in or outside of the home, wear a well-fitting mask. Tell your close contacts that they may have been exposed to COVID-19. An infected person can spread COVID-19 starting 48 hours (or 2 days)  before the person has any symptoms or tests positive. By letting your close contacts know they may have been exposed to COVID-19, you are helping to protect everyone. See COVID-19 and Animals if you have questions about pets. If you are diagnosed with COVID-19, someone from the health department may call you. Answer the call to slow the spread. Monitor your symptoms Symptoms of COVID-19 include fever, cough, or other symptoms. Follow care instructions from your healthcare provider and local health department. Your local health authorities may give instructions on checking your symptoms and reporting information. When to seek emergency medical attention Look for emergency warning signs* for COVID-19. If someone is showing any of these signs, seek emergency medical care immediately: Trouble breathing Persistent pain or pressure in the chest New confusion Inability to wake or stay awake Pale, gray, or blue-colored skin, lips, or nail beds, depending on skin tone *This list is not all possible  symptoms. Please call your medical provider for any other symptoms that are severe or concerning to you. Call 911 or call ahead to your local emergency facility: Notify the operator that you are seeking care for someone who has or may have COVID-19. Call ahead before visiting your doctor Call ahead. Many medical visits for routine care are being postponed or done by phone or telemedicine. If you have a medical appointment that cannot be postponed, call your doctor's office, and tell them you have or may have COVID-19. This will help the office protect themselves and other patients. If you are sick, wear a well-fitting mask You should wear a mask if you must be around other people or animals, including pets (even at home). Wear a mask with the best fit, protection, and comfort for you. You don't need to wear the mask if you are alone. If you can't put on a mask (because of trouble breathing, for example), cover your coughs and sneezes in some other way. Try to stay at least 6 feet away from other people. This will help protect the people around you. Masks should not be placed on young children under age 20 years, anyone who has trouble breathing, or anyone who is not able to remove the mask without help. Cover your coughs and sneezes Cover your mouth and nose with a tissue when you cough or sneeze. Throw away used tissues in a lined trash can. Immediately wash your hands with soap and water for at least 20 seconds. If soap and water are not available, clean your hands with an alcohol-based hand sanitizer that contains at least 60% alcohol. Clean your hands often Wash your hands often with soap and water for at least 20 seconds. This is especially important after blowing your nose, coughing, or sneezing; going to the bathroom; and before eating or preparing food. Use hand sanitizer if soap and water are not available. Use an alcohol-based hand sanitizer with at least 60% alcohol, covering all surfaces  of your hands and rubbing them together until they feel dry. Soap and water are the best option, especially if hands are visibly dirty. Avoid touching your eyes, nose, and mouth with unwashed hands. Handwashing Tips Avoid sharing personal household items Do not share dishes, drinking glasses, cups, eating utensils, towels, or bedding with other people in your home. Wash these items thoroughly after using them with soap and water or put in the dishwasher. Clean surfaces in your home regularly Clean and disinfect high-touch surfaces (for example, doorknobs, tables, handles, light switches, and countertops) in your sick room  and bathroom. In shared spaces, you should clean and disinfect surfaces and items after each use by the person who is ill. If you are sick and cannot clean, a caregiver or other person should only clean and disinfect the area around you (such as your bedroom and bathroom) on an as needed basis. Your caregiver/other person should wait as long as possible (at least several hours) and wear a mask before entering, cleaning, and disinfecting shared spaces that you use. Clean and disinfect areas that may have blood, stool, or body fluids on them. Use household cleaners and disinfectants. Clean visible dirty surfaces with household cleaners containing soap or detergent. Then, use a household disinfectant. Use a product from [employer] List N: Disinfectants for Coronavirus (COVID-19). Be sure to follow the instructions on the label to ensure safe and effective use of the product. Many products recommend keeping the surface wet with a disinfectant for a certain period of time (look at contact time on the product label). You may also need to wear personal protective equipment, such as gloves, depending on the directions on the product label. Immediately after disinfecting, wash your hands with soap and water for 20 seconds. For completed guidance on cleaning and disinfecting your home, visit  Complete Disinfection Guidance. Take steps to improve ventilation at home Improve ventilation (air flow) at home to help prevent from spreading COVID-19 to other people in your household. Clear out COVID-19 virus particles in the air by opening windows, using air filters, and turning on fans in your home. Use this interactive tool to learn how to improve air flow in your home. When you can be around others after being sick with COVID-19 Deciding when you can be around others is different for different situations. Find out when you can safely end home isolation. For any additional questions about your care, contact your healthcare provider or state or local health department. 07/22/2020 Content source: [facility] for Immunization and Respiratory Diseases ([facility]), Division of Viral Diseases This information is not intended to replace advice given to you by your health care provider. Make sure you discuss any questions you have with your health care provider. Document Revised: 09/04/2020 Document Reviewed: 09/04/2020 Elsevier Patient Education  2022 [employer].     If you have been instructed to have an in-person evaluation today at a local Urgent Care facility, please use the link below. It will take you to a list of all of our available [facility], including address, phone number and hours of operation. Please do not delay care.  [facility]  If you or a family member do not have a primary care provider, use the link below to schedule a visit and establish care. When you choose a [facility] primary care physician or advanced practice provider, you gain a long-term partner in health. Find a Primary Care Provider  Learn more about [facility]'s in-office and virtual care options: [facility] - Get Care Now

## 2024-01-21 ENCOUNTER — Other Ambulatory Visit: Payer: Self-pay | Admitting: Family Medicine

## 2024-01-21 DIAGNOSIS — I10 Essential (primary) hypertension: Secondary | ICD-10-CM

## 2024-02-16 ENCOUNTER — Ambulatory Visit
Admission: EM | Admit: 2024-02-16 | Discharge: 2024-02-16 | Disposition: A | Discharge status: Home / Self Care | Attending: Family Medicine | Admitting: Family Medicine

## 2024-02-16 ENCOUNTER — Other Ambulatory Visit: Payer: Self-pay

## 2024-02-16 DIAGNOSIS — J209 Acute bronchitis, unspecified: Secondary | ICD-10-CM | POA: Diagnosis not present

## 2024-02-16 MED ORDER — AMOXICILLIN-POT CLAVULANATE 875-125 MG PO TABS: 1.0000 | ORAL_TABLET | Freq: Two times a day (BID) | ORAL | 0 refills | Status: DC

## 2024-02-16 MED ORDER — PROMETHAZINE-DM 6.25-15 MG/5ML PO SYRP: 5.0000 mL | ORAL_SOLUTION | Freq: Four times a day (QID) | ORAL | 0 refills | Status: AC | PRN

## 2024-02-16 MED ORDER — ALBUTEROL SULFATE HFA 108 (90 BASE) MCG/ACT IN AERS: 2.0000 | INHALATION_SPRAY | Freq: Four times a day (QID) | RESPIRATORY_TRACT | 0 refills | Status: DC | PRN

## 2024-02-16 NOTE — ED Provider Notes (Signed)
 Megan Chavez    CSN: [id number] Arrival date & time: 02/16/24  1657      History   Chief Complaint Chief Complaint  Patient presents with   Cough   Facial Pain    HPI Megan Chavez is a 48 y.o. female.    Cough Nonproductive cough for 10 days more frequent x 1 day, admits rhinorrhea, nasal congestion, fullness in her ears.  Admits tight feeling in her chest when she coughs.  Denies fever, chills, sweats, wheezing, shortness of breath, abdominal pain, nausea, vomiting, diarrhea, ear pain, sore throat, swollen glands.  Denies recent travel.  Denies known contacts with illness.  Taking over-the-counter Allegra-D, Sudafed, nasal spray and ibuprofen without relief.  Has had asthma-like symptoms in the past, often will require an inhaler when she gets sick.  Has an expired inhaler at home has not tried using it.  Past medical history hypertension, thyroid  disorder, reflux. Past Medical History:  Diagnosis Date   Hypothyroidism due to Hashimoto's thyroiditis 03/04/2017    Patient Active Problem List   Diagnosis Date Noted   High thyroid  stimulating hormone (TSH) level 08/30/2023   Rhus dermatitis 08/30/2023   Acute cystitis without hematuria 06/07/2023   GERD (gastroesophageal reflux disease) 11/02/2022   Palpitations 11/02/2022   Essential (primary) hypertension 09/25/2021   Subclinical hypothyroidism 12/03/2020   Elevated LDL cholesterol level 06/19/2019   Overactive bladder 04/03/2019   Abdominal pain, right upper quadrant 08/18/2018   Abnormal finding on mammography 12/22/2017   Dyshidrotic eczema 07/18/2017    Class: History of   Pityriasis rosea 07/18/2017   Hypothyroidism due to Hashimoto's thyroiditis 03/04/2017   Overweight (BMI 25.0-29.9) 03/04/2017    Past Surgical History:  Procedure Laterality Date   CESAREAN SECTION  10/2000   CESAREAN SECTION  02/2003   CESAREAN SECTION  06/2006    OB History   No obstetric history on file.      Home  Medications    Prior to Admission medications   Medication Sig Start Date End Date Taking? Authorizing Provider  albuterol  (VENTOLIN  HFA) 108 (90 Base) MCG/ACT inhaler Inhale 2 puffs into the lungs every 6 (six) hours as needed for up to 10 days for wheezing or shortness of breath. 02/16/24 02/26/24 Yes Megan Franchini, PA  amoxicillin -clavulanate (AUGMENTIN ) 875-125 MG tablet Take 1 tablet by mouth every 12 (twelve) hours. 02/16/24  Yes Megan Corvino, PA  promethazine-dextromethorphan (PROMETHAZINE-DM) 6.25-15 MG/5ML syrup Take 5 mLs by mouth 4 (four) times daily as needed for up to 6 days for cough. 02/16/24 02/22/24 Yes Megan Wittke, PA  esomeprazole (NEXIUM) 40 MG capsule Take 40 mg by mouth in the morning. 03/18/23   [provider]  hyoscyamine (LEVSIN SL) 0.125 MG SL tablet Take 0.125 mg by mouth in the morning and at bedtime. 05/20/23   [provider]  levothyroxine (SYNTHROID) 75 MCG tablet Take 1 tablet by mouth daily. 05/28/18   [provider]  losartan  (COZAAR ) 50 MG tablet TAKE 1 TABLET BY MOUTH EVERY DAY 01/23/24   Megan Dorothyann BIRCH, MD    Family History Family History  Problem Relation Age of Onset   Stroke Father    Hypertension Father    Hashimoto's thyroiditis Sister    Hypothyroidism Sister    Leukemia Daughter     Social History Social History   Tobacco Use   Smoking status: Former    Current packs/day: 0.00    Types: Cigarettes    Quit date: 03/10/2000    Years since  quitting: 23.9   Smokeless tobacco: Never  Vaping Use   Vaping status: Never Used  Substance Use Topics   Alcohol use: Yes    Comment: Alcohol occ   Drug use: No     Allergies   Dust mite extract and Molds & smuts   Review of Systems Review of Systems  Respiratory:  Positive for cough.      Physical Exam Triage Vital Signs ED Triage Vitals  Encounter Vitals Group     BP 02/16/24 1712 (!) 154/80     Girls Systolic BP Percentile --      Girls  Diastolic BP Percentile --      Boys Systolic BP Percentile --      Boys Diastolic BP Percentile --      Pulse Rate 02/16/24 1712 72     Resp 02/16/24 1712 20     Temp 02/16/24 1712 98 F (36.7 C)     Temp Source 02/16/24 1712 Oral     SpO2 02/16/24 1712 98 %     Weight --      Height --      Head Circumference --      Peak Flow --      Pain Score 02/16/24 1708 3     Pain Loc --      Pain Education --      Exclude from Growth Chart --    No data found.  Updated Vital Signs BP (!) 154/80 (BP Location: Right Arm)   Pulse 72   Temp 98 F (36.7 C) (Oral)   Resp 20   LMP 02/15/2024 (Exact Date)   SpO2 98%   Visual Acuity Right Eye Distance:   Left Eye Distance:   Bilateral Distance:    Right Eye Near:   Left Eye Near:    Bilateral Near:     Physical Exam Vitals and nursing note reviewed.  Constitutional:      Appearance: She is not ill-appearing.  HENT:     Head: Normocephalic and atraumatic.     Right Ear: Tympanic membrane and ear canal normal.     Left Ear: Tympanic membrane and ear canal normal.     Nose: Congestion present. No rhinorrhea.     Right Sinus: No maxillary sinus tenderness or frontal sinus tenderness.     Left Sinus: No maxillary sinus tenderness or frontal sinus tenderness.     Mouth/Throat:     Mouth: Mucous membranes are moist.     Pharynx: Oropharynx is clear.  Eyes:     General:        Right eye: No discharge.        Left eye: No discharge.     Conjunctiva/sclera: Conjunctivae normal.  Cardiovascular:     Rate and Rhythm: Normal rate and regular rhythm.     Heart sounds: Normal heart sounds.  Pulmonary:     Effort: Pulmonary effort is normal. No respiratory distress.     Breath sounds: Normal breath sounds. No wheezing, rhonchi or rales.  Musculoskeletal:     Cervical back: Neck supple.  Lymphadenopathy:     Cervical: No cervical adenopathy.  Skin:    General: Skin is warm.  Neurological:     Mental Status: She is alert.       Chavez Treatments / Results  Labs (all labs ordered are listed, but only abnormal results are displayed) Labs Reviewed - No data to display  EKG   Radiology No results found.  Procedures Procedures (  including critical care time)  Medications Ordered in Chavez Medications - No data to display  Initial Impression / Assessment and Plan / Chavez Course  I have reviewed the triage vital signs and the nursing notes.  Pertinent labs & imaging results that were available during my care of the patient were reviewed by me and considered in my medical decision making (see chart for details).    49 year old female with cough for 10 days, states cough is getting worse.  Admits some tightness in her chest when she coughs denies wheezing, shortness of breath, fever, body aches, fatigue.  Has had asthma symptoms in the past treated with an inhaler.  Has history of hypertension.  She is well-appearing but coughing frequently, mildly hypertensive at 154/80, afebrile, lungs are clear to auscultation no evidence of bacterial infection no sinus tenderness.  Discussed with patient likely bronchitis will treat with inhaler and cough medicine, if symptoms last greater than 10 days she develops worsening sinus pain or pressure fever can fill the paper prescription she was given for Augmentin .  Should follow-up with PCP if not, ED for severe symptoms. Final Clinical Impressions(s) / Chavez Diagnoses   Final diagnoses:  Acute bronchitis, unspecified organism     Discharge Instructions      Follow-up with your doctor if your symptoms fail to improve.  If you develop new symptoms such as fever, increased sinus pain or pressure, green nasal drainage can fill the prescription for antibiotic and take as written.     ED Prescriptions     Medication Sig Dispense Auth. Provider   albuterol  (VENTOLIN  HFA) 108 (90 Base) MCG/ACT inhaler Inhale 2 puffs into the lungs every 6 (six) hours as needed for up to 10 days for  wheezing or shortness of breath. 1 each Leshawn Straka, PA   promethazine-dextromethorphan (PROMETHAZINE-DM) 6.25-15 MG/5ML syrup Take 5 mLs by mouth 4 (four) times daily as needed for up to 6 days for cough. 118 mL Vail Vuncannon, PA   amoxicillin -clavulanate (AUGMENTIN ) 875-125 MG tablet Take 1 tablet by mouth every 12 (twelve) hours. 14 tablet Merlina Marchena, [place]      PDMP not reviewed this encounter.   Loney Domingo, [place] 02/16/24 1730

## 2024-02-16 NOTE — ED Triage Notes (Signed)
 Pt states that she started having congestion last week and she thinks it turned into a sinus infection. Pt states he sinuses have been hurting. Pt states she has a cough that began last week but got worse the past two days and now her chest is tight. Pt worries that it has turned into bronchitis or a URI. Pt states she has not been around anyone sick and she has not had a fever. Pt has taken Allegra D, sudafed, saline nose spray, and Ibuprofen. Pt has not taken anything for her cough at this time. Pt states cough is dry and non-productive today.

## 2024-02-16 NOTE — Discharge Instructions (Addendum)
 Follow-up with your doctor if your symptoms fail to improve.  If you develop new symptoms such as fever, increased sinus pain or pressure, green nasal drainage can fill the prescription for antibiotic and take as written.

## 2024-04-02 ENCOUNTER — Ambulatory Visit: Payer: Self-pay

## 2024-04-02 ENCOUNTER — Encounter: Payer: Self-pay | Admitting: Family Medicine

## 2024-04-02 ENCOUNTER — Ambulatory Visit: Admitting: Family Medicine

## 2024-04-02 VITALS — BP 134/78 | HR 77 | Ht 66.0 in | Wt 184.1 lb

## 2024-04-02 DIAGNOSIS — J019 Acute sinusitis, unspecified: Secondary | ICD-10-CM | POA: Diagnosis not present

## 2024-04-02 DIAGNOSIS — R0981 Nasal congestion: Secondary | ICD-10-CM | POA: Diagnosis not present

## 2024-04-02 LAB — POC SOFIA 2 FLU + SARS ANTIGEN FIA
Influenza A, POC: NEGATIVE
Influenza B, POC: NEGATIVE
SARS Coronavirus 2 Ag: NEGATIVE

## 2024-04-02 MED ORDER — PREDNISONE 20 MG PO TABS: 40.0000 mg | ORAL_TABLET | Freq: Every day | ORAL | 0 refills | Status: AC

## 2024-04-02 NOTE — Progress Notes (Signed)
 Acute Office Visit  Patient ID: Megan Chavez, female    DOB: 08-31-1975, 48 y.o.   MRN: [id number]  PCP: Alvan Dorothyann BIRCH, MD  Chief Complaint  Patient presents with   Sinusitis    Subjective:     HPI  Discussed the use of AI scribe software for clinical note transcription with the patient, who gave verbal consent to proceed.  History of Present Illness Megan Chavez is a 48 year old female who presents with sinus pressure and neck soreness.  Sinus pressure and postnasal drip - Sinus pressure present for the past couple of weeks, described as bilateral and radiating to the teeth - Pressure worsens with head movement, making it difficult to turn her head and affecting her ability to drive - Pressure shifts to the opposite side when lying down - No significant nasal drainage; blowing her nose does not produce much - Postnasal drip has been persistent and bothersome - No fever over the weekend  Neck soreness - Neck soreness developed on Saturday, associated with sinus pressure - Soreness makes it difficult to turn her head - Pressure improves with massage but not with warm compresses  Response to treatments - Saline nasal spray provides temporary relief - Nasal irrigation attempted but difficult due to tightness in sinuses; able to taste saline today, indicating some improvement - Warm compresses have not provided relief  Recent exposure history - Traveled for Thanksgiving and was around many people - Initially denied exposure to large groups, later realized exposure occurred   ROS     Objective:    BP 134/78   Pulse 77   Ht 5' 6 (1.676 m)   Wt 184 lb 1.9 oz (83.5 kg)   SpO2 98%   BMI 29.72 kg/m    Physical Exam Constitutional:      Appearance: Normal appearance.  HENT:     Head: Normocephalic and atraumatic.     Right Ear: Tympanic membrane, ear canal and external ear normal. There is no impacted cerumen.     Left Ear: Tympanic membrane, ear canal  and external ear normal. There is no impacted cerumen.     Nose: Nose normal.     Mouth/Throat:     Pharynx: Oropharynx is clear.  Eyes:     Conjunctiva/sclera: Conjunctivae normal.  Cardiovascular:     Rate and Rhythm: Normal rate and regular rhythm.  Pulmonary:     Effort: Pulmonary effort is normal.     Breath sounds: Normal breath sounds.  Musculoskeletal:     Cervical back: Neck supple. No tenderness.  Lymphadenopathy:     Cervical: No cervical adenopathy.  Skin:    General: Skin is warm and dry.  Neurological:     Mental Status: She is alert and oriented to person, place, and time.  Psychiatric:        Mood and Affect: Mood normal.       Results for orders placed or performed in visit on 04/02/24  POC SOFIA 2 FLU + SARS ANTIGEN FIA  Result Value Ref Range   Influenza A, POC Negative Negative   Influenza B, POC Negative Negative   SARS Coronavirus 2 Ag Negative Negative       Assessment & Plan:   Problem List Items Addressed This Visit   None Visit Diagnoses       Congestion of nasal sinus    -  Primary   Relevant Medications   predniSONE  (DELTASONE ) 20 MG tablet   Other Relevant Orders  POC SOFIA 2 FLU + SARS ANTIGEN FIA (Completed)     Acute non-recurrent sinusitis, unspecified location       Relevant Medications   predniSONE  (DELTASONE ) 20 MG tablet       Assessment and Plan Assessment & Plan Acute viral upper respiratory infection with sinus pressure Likely viral etiology with nasal drip, sinus pressure, and neck soreness. Negative for flu and COVID. Differential includes viral sinusitis. - Prescribed prednisone  for sinus pressure relief. - Continue saline nasal spray and irrigation. - Advised to report if symptoms worsen or do not improve over the next several days.    Meds ordered this encounter  Medications   predniSONE  (DELTASONE ) 20 MG tablet    Sig: Take 2 tablets (40 mg total) by mouth daily with breakfast.    Dispense:  10 tablet     Refill:  0    No follow-ups on file.  Dorothyann Byars, MD [facility] at [facility]

## 2024-04-02 NOTE — Telephone Encounter (Signed)
 FYI Only or Action Required?: FYI only for provider: appointment scheduled on 04/02/24.  Patient was last seen in primary care on 01/05/2024 by Vivienne Delon HERO, PA-C.  Called Nurse Triage reporting Sinusitis.  Symptoms began several weeks ago.  Interventions attempted: OTC medications: Sudafed, Advil, Vicks Vapor Rub; warm compress.  Symptoms are: nasal congestion x few weeks; sinus pressure, ear aches, cough, sore throat since Saturday gradually worsening.  Triage Disposition: See Physician Within 24 Hours  Patient/caregiver understands and will follow disposition?: Yes           Copied from CRM #[id number]. Topic: Clinical - Red Word Triage >> Apr 02, 2024  7:44 AM Delon HERO wrote: Red Word that prompted transfer to Nurse Triage: Patient is calling to report that he sinus are swollen in her neck that she is unable to turn her heard since Saturday. With pressure in face and nose congestion, and ears are clogged. Reason for Disposition  Earache  Answer Assessment - Initial Assessment Questions 1. LOCATION: Where does it hurt?      Teeth, under and above both eyes.  2. ONSET: When did the sinus pain start?  (e.g., hours, days)      Saturday.  3. SEVERITY: How bad is the pain?   (Scale 0-10; or none, mild, moderate or severe)     Pressure, slightly better this morning compared to yesterday. Painful to turn her head left and right. 3/10 after Advil.  4. RECURRENT SYMPTOM: Have you ever had sinus problems before? If Yes, ask: When was the last time? and What happened that time?      Yes. Last year she states she had a sinus infection, came in and was given an antibiotic.  5. NASAL CONGESTION: Is the nose blocked? If Yes, ask: Can you open it or must you breathe through your mouth?     She states one side if blocked but she can usually get one side open. She states the post nasal drip and nasal congestion has been a few weeks.  6. NASAL DISCHARGE: Do you have  discharge from your nose? If so ask, What color?     Yes, clear.  7. FEVER: Do you have a fever? If Yes, ask: What is it, how was it measured, and when did it start?      No.  8. OTHER SYMPTOMS: Do you have any other symptoms? (e.g., sore throat, cough, earache, difficulty breathing)     Cough, bilateral earaches and pressure/congestion, sore throat. Denies difficulty breathing.  9. PREGNANCY: Is there any chance you are pregnant? When was your last menstrual period?     LMP: 3 weeks ago, not pregnant.  Patient states she has tried OTC Sudafed and Advil, warm compress, steam, Vicks vapor rub.  Protocols used: Sinus Pain or Congestion-A-AH

## 2024-08-06 ENCOUNTER — Encounter: Admitting: Family Medicine
